# Patient Record
Sex: Male | Born: 1955 | Race: White | Hispanic: No | Marital: Married | Smoking: Former smoker
Health system: Southern US, Community
[De-identification: ages and names within clinical notes are randomized; demographics above are authoritative.]

## PROBLEM LIST (undated history)

## (undated) DIAGNOSIS — I499 Cardiac arrhythmia, unspecified: Secondary | ICD-10-CM

## (undated) DIAGNOSIS — J310 Chronic rhinitis: Secondary | ICD-10-CM

## (undated) DIAGNOSIS — T7840XA Allergy, unspecified, initial encounter: Secondary | ICD-10-CM

## (undated) DIAGNOSIS — E785 Hyperlipidemia, unspecified: Secondary | ICD-10-CM

## (undated) DIAGNOSIS — I10 Essential (primary) hypertension: Secondary | ICD-10-CM

## (undated) DIAGNOSIS — J309 Allergic rhinitis, unspecified: Secondary | ICD-10-CM

## (undated) DIAGNOSIS — J329 Chronic sinusitis, unspecified: Secondary | ICD-10-CM

## (undated) DIAGNOSIS — M1990 Unspecified osteoarthritis, unspecified site: Secondary | ICD-10-CM

## (undated) HISTORY — DX: Chronic rhinitis: J31.0

## (undated) HISTORY — DX: Allergy, unspecified, initial encounter: T78.40XA

## (undated) HISTORY — DX: Chronic sinusitis, unspecified: J32.9

## (undated) HISTORY — DX: Unspecified osteoarthritis, unspecified site: M19.90

## (undated) HISTORY — PX: OTHER SURGICAL HISTORY: SHX169

## (undated) HISTORY — DX: Hyperlipidemia, unspecified: E78.5

## (undated) HISTORY — DX: Cardiac arrhythmia, unspecified: I49.9

## (undated) HISTORY — PX: ROTATOR CUFF REPAIR: SHX139

## (undated) HISTORY — DX: Allergic rhinitis, unspecified: J30.9

## (undated) HISTORY — DX: Essential (primary) hypertension: I10

## (undated) HISTORY — PX: COLONOSCOPY: SHX174

---

## 2002-02-22 ENCOUNTER — Encounter: Payer: Self-pay | Admitting: Occupational Medicine

## 2002-02-22 ENCOUNTER — Encounter: Admission: RE | Admit: 2002-02-22 | Discharge: 2002-02-22 | Payer: Self-pay | Admitting: Occupational Medicine

## 2002-05-09 ENCOUNTER — Ambulatory Visit (HOSPITAL_BASED_OUTPATIENT_CLINIC_OR_DEPARTMENT_OTHER): Admission: RE | Admit: 2002-05-09 | Discharge: 2002-05-09 | Payer: Self-pay | Admitting: Orthopedic Surgery

## 2008-01-07 ENCOUNTER — Emergency Department (HOSPITAL_COMMUNITY): Admission: EM | Admit: 2008-01-07 | Discharge: 2008-01-07 | Payer: Self-pay | Admitting: Emergency Medicine

## 2008-01-09 ENCOUNTER — Ambulatory Visit: Payer: Self-pay | Admitting: Internal Medicine

## 2008-01-09 DIAGNOSIS — R002 Palpitations: Secondary | ICD-10-CM | POA: Insufficient documentation

## 2008-01-09 DIAGNOSIS — E049 Nontoxic goiter, unspecified: Secondary | ICD-10-CM | POA: Insufficient documentation

## 2008-01-09 DIAGNOSIS — J45909 Unspecified asthma, uncomplicated: Secondary | ICD-10-CM | POA: Insufficient documentation

## 2008-01-09 DIAGNOSIS — J309 Allergic rhinitis, unspecified: Secondary | ICD-10-CM | POA: Insufficient documentation

## 2008-01-10 ENCOUNTER — Telehealth: Payer: Self-pay | Admitting: Internal Medicine

## 2008-01-11 ENCOUNTER — Encounter: Admission: RE | Admit: 2008-01-11 | Discharge: 2008-01-11 | Payer: Self-pay | Admitting: Internal Medicine

## 2008-01-14 ENCOUNTER — Encounter: Payer: Self-pay | Admitting: Internal Medicine

## 2008-01-14 DIAGNOSIS — R222 Localized swelling, mass and lump, trunk: Secondary | ICD-10-CM | POA: Insufficient documentation

## 2008-01-19 ENCOUNTER — Encounter: Admission: RE | Admit: 2008-01-19 | Discharge: 2008-01-19 | Payer: Self-pay | Admitting: Internal Medicine

## 2008-01-21 ENCOUNTER — Encounter: Payer: Self-pay | Admitting: Internal Medicine

## 2008-01-21 ENCOUNTER — Ambulatory Visit: Payer: Self-pay

## 2008-01-21 ENCOUNTER — Telehealth (INDEPENDENT_AMBULATORY_CARE_PROVIDER_SITE_OTHER): Payer: Self-pay | Admitting: *Deleted

## 2008-01-22 ENCOUNTER — Encounter: Payer: Self-pay | Admitting: Cardiology

## 2008-01-25 ENCOUNTER — Ambulatory Visit: Payer: Self-pay

## 2008-01-25 ENCOUNTER — Ambulatory Visit: Payer: Self-pay | Admitting: Cardiology

## 2008-03-25 ENCOUNTER — Ambulatory Visit: Payer: Self-pay | Admitting: Cardiology

## 2008-04-24 ENCOUNTER — Ambulatory Visit: Payer: Self-pay | Admitting: Cardiology

## 2008-07-02 ENCOUNTER — Ambulatory Visit: Payer: Self-pay | Admitting: Internal Medicine

## 2008-07-02 DIAGNOSIS — F17200 Nicotine dependence, unspecified, uncomplicated: Secondary | ICD-10-CM | POA: Insufficient documentation

## 2008-07-03 ENCOUNTER — Encounter (INDEPENDENT_AMBULATORY_CARE_PROVIDER_SITE_OTHER): Payer: Self-pay | Admitting: *Deleted

## 2008-10-13 ENCOUNTER — Ambulatory Visit: Payer: Self-pay | Admitting: Internal Medicine

## 2008-10-13 DIAGNOSIS — J329 Chronic sinusitis, unspecified: Secondary | ICD-10-CM | POA: Insufficient documentation

## 2008-10-15 ENCOUNTER — Ambulatory Visit: Payer: Self-pay | Admitting: Cardiology

## 2008-11-26 ENCOUNTER — Ambulatory Visit: Payer: Self-pay | Admitting: Internal Medicine

## 2009-01-13 ENCOUNTER — Ambulatory Visit: Payer: Self-pay | Admitting: Internal Medicine

## 2011-01-04 ENCOUNTER — Ambulatory Visit
Admission: RE | Admit: 2011-01-04 | Discharge: 2011-01-04 | Payer: Self-pay | Source: Home / Self Care | Attending: Internal Medicine | Admitting: Internal Medicine

## 2011-01-04 ENCOUNTER — Encounter (INDEPENDENT_AMBULATORY_CARE_PROVIDER_SITE_OTHER): Payer: Self-pay | Admitting: *Deleted

## 2011-01-04 ENCOUNTER — Encounter: Payer: Self-pay | Admitting: Internal Medicine

## 2011-01-04 ENCOUNTER — Other Ambulatory Visit: Payer: Self-pay | Admitting: Internal Medicine

## 2011-01-04 DIAGNOSIS — R109 Unspecified abdominal pain: Secondary | ICD-10-CM | POA: Insufficient documentation

## 2011-01-04 DIAGNOSIS — I4949 Other premature depolarization: Secondary | ICD-10-CM | POA: Insufficient documentation

## 2011-01-04 DIAGNOSIS — K921 Melena: Secondary | ICD-10-CM | POA: Insufficient documentation

## 2011-01-04 DIAGNOSIS — E860 Dehydration: Secondary | ICD-10-CM | POA: Insufficient documentation

## 2011-01-04 LAB — H. PYLORI ANTIBODY, IGG: H Pylori IgG: NEGATIVE

## 2011-01-04 LAB — URINALYSIS, ROUTINE W REFLEX MICROSCOPIC
Bilirubin Urine: NEGATIVE
Ketones, ur: NEGATIVE
Leukocytes, UA: NEGATIVE
Nitrite: NEGATIVE
Specific Gravity, Urine: <=1.005 (ref 1.000–1.030)
Total Protein, Urine: NEGATIVE
Urine Glucose: NEGATIVE
Urobilinogen, UA: 0.2 (ref 0.0–1.0)
pH: 6 (ref 5.0–8.0)

## 2011-01-04 LAB — BASIC METABOLIC PANEL
BUN: 15 mg/dL (ref 6–23)
CO2: 29 mEq/L (ref 19–32)
Calcium: 9.3 mg/dL (ref 8.4–10.5)
Chloride: 104 mEq/L (ref 96–112)
Creatinine, Ser: 1.1 mg/dL (ref 0.4–1.5)
GFR: 77.44 mL/min (ref >60.00)
Glucose, Bld: 79 mg/dL (ref 70–99)
Potassium: 4.5 mEq/L (ref 3.5–5.1)
Sodium: 139 mEq/L (ref 135–145)

## 2011-01-04 LAB — CBC WITH DIFFERENTIAL/PLATELET
Basophils Absolute: 0.1 10*3/uL (ref 0.0–0.1)
Basophils Relative: 0.5 % (ref 0.0–3.0)
Eosinophils Absolute: 0.2 10*3/uL (ref 0.0–0.7)
Eosinophils Relative: 1.8 % (ref 0.0–5.0)
HCT: 46.8 % (ref 39.0–52.0)
Hemoglobin: 16.1 g/dL (ref 13.0–17.0)
Lymphocytes Relative: 31.5 % (ref 12.0–46.0)
Lymphs Abs: 3.8 10*3/uL (ref 0.7–4.0)
MCHC: 34.5 g/dL (ref 30.0–36.0)
MCV: 93.3 fl (ref 78.0–100.0)
Monocytes Absolute: 0.8 10*3/uL (ref 0.1–1.0)
Monocytes Relative: 6.4 % (ref 3.0–12.0)
Neutro Abs: 7.1 10*3/uL (ref 1.4–7.7)
Neutrophils Relative %: 59.8 % (ref 43.0–77.0)
Platelets: 282 10*3/uL (ref 150.0–400.0)
RBC: 5.01 Mil/uL (ref 4.22–5.81)
RDW: 12.8 % (ref 11.5–14.6)
WBC: 11.9 10*3/uL — ABNORMAL HIGH (ref 4.5–10.5)

## 2011-01-04 LAB — PSA: PSA: 1 ng/mL (ref 0.10–4.00)

## 2011-01-04 LAB — HEPATIC FUNCTION PANEL
ALT: 33 U/L (ref 0–53)
AST: 24 U/L (ref 0–37)
Albumin: 4.1 g/dL (ref 3.5–5.2)
Alkaline Phosphatase: 78 U/L (ref 39–117)
Bilirubin, Direct: 0.2 mg/dL (ref 0.0–0.3)
Total Bilirubin: 0.9 mg/dL (ref 0.3–1.2)
Total Protein: 7.5 g/dL (ref 6.0–8.3)

## 2011-01-04 LAB — LIPID PANEL
Cholesterol: 222 mg/dL — ABNORMAL HIGH (ref 0–200)
HDL: 31.9 mg/dL — ABNORMAL LOW (ref >39.00)
Total CHOL/HDL Ratio: 7
Triglycerides: 122 mg/dL (ref 0.0–149.0)
VLDL: 24.4 mg/dL (ref 0.0–40.0)

## 2011-01-04 LAB — IBC PANEL
Iron: 82 ug/dL (ref 42–165)
Saturation Ratios: 21.1 % (ref 20.0–50.0)
Transferrin: 277.9 mg/dL (ref 212.0–360.0)

## 2011-01-04 LAB — TSH: TSH: 1.28 u[IU]/mL (ref 0.35–5.50)

## 2011-01-05 ENCOUNTER — Encounter: Payer: Self-pay | Admitting: Gastroenterology

## 2011-01-05 ENCOUNTER — Encounter (INDEPENDENT_AMBULATORY_CARE_PROVIDER_SITE_OTHER): Payer: Self-pay | Admitting: *Deleted

## 2011-01-05 LAB — LDL CHOLESTEROL, DIRECT: Direct LDL: 173.5 mg/dL

## 2011-01-06 ENCOUNTER — Ambulatory Visit: Payer: Self-pay | Admitting: Internal Medicine

## 2011-01-07 ENCOUNTER — Ambulatory Visit
Admission: RE | Admit: 2011-01-07 | Discharge: 2011-01-07 | Payer: Self-pay | Source: Home / Self Care | Attending: Endocrinology | Admitting: Endocrinology

## 2011-01-07 ENCOUNTER — Ambulatory Visit
Admission: RE | Admit: 2011-01-07 | Discharge: 2011-01-07 | Payer: Self-pay | Source: Home / Self Care | Attending: Gastroenterology | Admitting: Gastroenterology

## 2011-01-07 ENCOUNTER — Encounter: Payer: Self-pay | Admitting: Endocrinology

## 2011-01-07 ENCOUNTER — Encounter (INDEPENDENT_AMBULATORY_CARE_PROVIDER_SITE_OTHER): Payer: Self-pay | Admitting: *Deleted

## 2011-01-07 DIAGNOSIS — F411 Generalized anxiety disorder: Secondary | ICD-10-CM | POA: Insufficient documentation

## 2011-01-07 DIAGNOSIS — K625 Hemorrhage of anus and rectum: Secondary | ICD-10-CM | POA: Insufficient documentation

## 2011-01-07 DIAGNOSIS — R12 Heartburn: Secondary | ICD-10-CM | POA: Insufficient documentation

## 2011-01-12 ENCOUNTER — Encounter: Payer: Self-pay | Admitting: Endocrinology

## 2011-01-17 LAB — CONVERTED CEMR LAB: Metaneph Total, Ur: 272 ug/24hr (ref 224–832)

## 2011-01-23 LAB — CONVERTED CEMR LAB
HDL: 28.9 mg/dL — ABNORMAL LOW (ref >39.0)
PSA: 1.01 ng/mL (ref 0.10–4.00)
VLDL: 24 mg/dL (ref 0–40)

## 2011-01-27 NOTE — Letter (Signed)
Summary: Out of Work  [facility]  [address] [place], [place] [address]   Phone: [phone]  Fax: [phone]    January 04, 2011   Employee:  Martin Walker    To Whom It May Concern:   For Medical reasons, please excuse the above named employee from work for the following dates:  Start:   01/04/2011  End:   01/06/2011  If you need additional information, please feel free to contact our office.         Sincerely,    Dr. Oliver Barre

## 2011-01-27 NOTE — Assessment & Plan Note (Signed)
History of Present Illness Visit Type: Initial Visit Primary GI MD: Rob Bunting MD Primary Provider: Oliver Barre, MD Chief Complaint: rectal bleeding History of Present Illness:     very pleasant 55 year old man who has had intermittent rectal bleeding for 10-12 years.  This always bright red.  Years ago he was usually constipated, but that has improved.  Overall stable weight.    He has had epigastric discomforts intermittently, tells me he thinks it is stress related.  eating does not clearly bring it on.  Used to take ibuprofen for his knees (several a day), stopped 5-6 years ago.  PCP visit earlier this week, left sided abd pains,  CT scan showed no clear pathology.    he does have pyrosis, was taking tums like meds pretty often.  never tried h2 blocker,  he did try prilosec once but stopped.             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  All other review of systems was otherwise negative.   Vital Signs:  Patient profile:   55 year old male Height:      67 inches Weight:      168.25 pounds BMI:     26.45 Pulse rate:   68 / minute Pulse rhythm:   regular BP sitting:   100 / 62  (left arm) Cuff size:   regular  Vitals Entered  By: June McMurray CMA Duncan Dull) (January 07, 2011 9:05 AM)  Physical Exam  Additional Exam:  Constitutional: generally well appearing Psychiatric: alert and oriented times 3 Eyes: extraocular movements intact Mouth: oropharynx moist, no lesions Neck: supple, no lymphadenopathy Cardiovascular: heart regular rate and rythm Lungs: CTA bilaterally Abdomen: soft, non-tender, non-distended, no obvious ascites, no peritoneal signs, normal bowel sounds Extremities: no lower extremity edema bilaterally Skin: no lesions on visible extremities Rectal exam deferred for upcoming colonoscopy   Impression & Recommendations:  Problem # 1:  intermittent rectal bleeding I did not mention above but he did have a CBC earlier this month and it was normal. His rectal bleeding is minor, it sounds anorectal in origin. He has never had a colonoscopy and we will proceed with one at his soonest convenience.  Problem # 2:  epigastric discomfort this is GERD-like. He takes TUMS periodically and have recommended that he change to over-the-counter proton pump inhibitor once daily. He has also been given a GERD handout. Specifically I mentioned that caffeine intake, eating meals within 2-3 hours of laying down to go to bed, cigarette smoking are all common contributors to GERD.  we will proceed with EGD at the same time as his colonoscopy to screen him for GERD complications.  Patient Instructions: 1)  One of your biggest health concerns is your smoking.  You should try your absolute best to stop.  If you need assistence, please contact your PCP or Smoking Cessation Class at [facility] [phone]) or [facility] (1-800-QUIT-NOW). 2)  Start OTC medicine (prilosec or prevacid or generic equivalent), take one pill every 20-30 min before breakfast. 3)  GERD handout.  4)  You will be scheduled to have a colonoscopy. 5)  You will be scheduled to have an upper endoscopy. 6)  A copy of this information will be  sent to Dr. Jonny Ruiz. 7)  The medication list was reviewed and reconciled.  All changed / newly prescribed medications were explained.  A complete medication list was provided to the patient / caregiver.  Appended Document: Orders Update/movi    Clinical Lists Changes  Problems: Added new problem of HEMORRHAGE OF RECTUM AND ANUS (ICD-569.3) Added new problem of HEARTBURN (ICD-787.1) Medications: Added new medication of MOVIPREP 100 GM  SOLR (PEG-KCL-NACL-NASULF-NA ASC-C) As per prep instructions. - Signed Rx of MOVIPREP 100 GM  SOLR (PEG-KCL-NACL-NASULF-NA ASC-C) As per prep instructions.;  #1 x 0;  Signed;  Entered by: Chales Abrahams CMA (AAMA);  Authorized by: Rachael Fee MD;  Method used: Electronically to [facility] [address] [place], [place], [place]  [address], Ph: [phone], Fax: [phone] Orders: Added new Test order of Colon/Endo (Colon/Endo) - Signed    Prescriptions: MOVIPREP 100 GM  SOLR (PEG-KCL-NACL-NASULF-NA ASC-C) As per prep instructions.  #1 x 0   Entered by:   Chales Abrahams CMA (AAMA)   Authorized by:   Rachael Fee MD   Signed by:   Chales Abrahams CMA (AAMA) on 01/07/2011   Method used:   Electronically to        [facility] (retail)       [address]       [place], [place]  [address]       Ph: [phone]       Fax: [phone]   RxID:   [id number]

## 2011-01-27 NOTE — Letter (Signed)
Summary: New Patient letter  [facility]  [address] [place], [place] [address]   Phone: [phone]  Fax: [phone]       01/05/2011 MRN: [id number]  Martin Walker [address] [place], [place]  [address]  Dear Martin Walker,  Welcome to the Gastroenterology Division at [facility].    You are scheduled to see Dr.  Jarold Motto    on 02-08-11 at 2pm on the 3rd floor at [facility], [address].  We ask that you try to arrive at our office 15 minutes prior to your appointment time to allow for check-in.  We would like you to complete the enclosed self-administered evaluation form prior to your visit and bring it with you on the day of your appointment.  We will review it with you.  Also, please bring a complete list of all your medications or, if you prefer, bring the medication bottles and we will list them.  Please bring your insurance card so that we may make a copy of it.  If your insurance requires a referral to see a specialist, please bring your referral form from your primary care physician.  Co-payments are due at the time of your visit and may be paid by cash, check or credit card.     Your office visit will consist of a consult with your physician (includes a physical exam), any laboratory testing he/she may order, scheduling of any necessary diagnostic testing (e.g. x-ray, ultrasound, CT-scan), and scheduling of a procedure (e.g. Endoscopy, Colonoscopy) if required.  Please allow enough time on your schedule to allow for any/all of these possibilities.    If you cannot keep your appointment, please call [phone] to cancel or reschedule prior to your appointment date.  This allows [place] the opportunity to schedule an appointment for another patient in need of care.  If you do not cancel or reschedule by 5 p.m. the business day prior to your appointment date, you will be charged a $50.00 late cancellation/no-show fee.    Thank you for  choosing [facility] for your medical needs.  We appreciate the opportunity to care for you.  Please visit [place] at our website  to learn more about our practice.                     Sincerely,                                                             The Gastroenterology Division

## 2011-01-27 NOTE — Assessment & Plan Note (Signed)
Summary: anxiety attack/ advised to go to uc or er on mon/ LOV 2009/ N...   Vital Signs:  Patient profile:   55 year old male Height:      67 inches Weight:      168 pounds BMI:     26.41 O2 Sat:      97 % on Room air Temp:     98.8 degrees F oral Pulse rate:   75 / minute BP supine:   132 / 90  (right arm) BP sitting:   122 / 82  (right arm) BP standing:   120 / 84  (right arm) Cuff size:   regular  Vitals Entered By: Zella Ball Ewing CMA Duncan Dull) (January 04, 2011 3:39 PM)  O2 Flow:  Room air CC: Anxiety/RE   Primary Care Provider:  Oliver Barre  CC:  Anxiety/RE.  History of Present Illness: here to re-establish after lost to f/u x 3 yrs with wellness and f/u; has mult issues, including signicant anxety today with palpitations and known hx of sympt PVC's/PAC's similar to episode jan 2009 with normal echo and holter with same an neg lab w/u;  but this episode today different in that there is significant fatigue and dizziness/lightheaded for 1-2 days , assoc with 1-2 wks worsening epigastric pain and tenderness with occas nausea, no vomiting, and no bowel change but has had intemittent BRBPR for "yrs."  Feels less dizzzy to lie down,  OTC antacid may have helped for a few hrs.  Pt denies CP, worsening sob, doe, wheezing, orthopnea, pnd, worsening LE edema, or syncope .  Pt denies new neuro symptoms such as headache, facial or extremity weakness  Pt denies polydipsia, polyuria  Overall good compliance with meds, trying to follow low chol diet, wt stable, little excercise however  .  No fever, wt loss, night sweats, loss of appetite or other constitutional symptoms  Denies worsening depressive symptoms, suicidal ideation, or panic.   Pt states good ability with ADL's, low fall risk, home safety reviewed and adequate, no significant change in hearing or vision, trying to follow lower chol diet, and occasionally active only with regular excercise.  No hx of PUD, and has avoided colonscopy so far.     Problems Prior to Update: 1)  Dehydration  (ICD-276.51) 2)  Preventive Health Care  (ICD-V70.0) 3)  Other Premature Beats  (ICD-427.69) 4)  Abdominal Pain, Unspecified Site  (ICD-789.00) 5)  Hematochezia  (ICD-578.1) 6)  Tobacco Use Disorder/smoker-smoking Cessation Discussed  (ICD-305.1) 7)  Rhinosinusitis, Recurrent  (ICD-473.9) 8)  Swelling, Mass, or Lump in Chest  (ICD-786.6) 9)  Goiter  (ICD-240.9) 10)  Special Screening Malignant Neoplasm of Prostate  (ICD-V76.44) 11)  Asthma  (ICD-493.90) 12)  Allergic Rhinitis  (ICD-477.9) 13)  Palpitations  (ICD-785.1)  Medications Prior to Update: 1)  Singulair 10 Mg Tabs (Montelukast Sodium) .Marland Kitchen.. 1 By Mouth Daily 2)  Fluticasone Propionate 50 Mcg/act  Susp (Fluticasone Propionate) .... 2 Sprays Each Nostril Once Daily 3)  Zyrtec-D Allergy & Congestion 5-120 Mg  Tb12 (Cetirizine-Pseudoephedrine) .Marland Kitchen.. 1 By Mouth Bid  Current Medications (verified): 1)  Omeprazole 40 Mg Cpdr (Omeprazole) .Marland Kitchen.. 1po Once Daily 2)  Promethazine Hcl 25 Mg Tabs (Promethazine Hcl) .Marland Kitchen.. 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 mother with HTN, thyroid disease grandfather with lung cancer uncle  wtih colon cancer grandmother with brain tumor Sister - childhood asthma  Social History: Reviewed history from 10/13/2008 and no changes required. 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         all otherwise negative per pt -    Physical Exam  General:  alert and well-developed.   Head:  normocephalic and atraumatic.   Eyes:  vision grossly intact, pupils equal, and pupils round.   Ears:  R ear normal and L ear normal.   Nose:  no external deformity and no nasal discharge.   Mouth:  no gingival abnormalities and pharynx pink and moist.   Neck:  supple and no masses.   Lungs:  normal respiratory effort and normal breath sounds.   Heart:  normal rate and regular rhythm.  with occasional ectopic beat by auscultation Abdomen:  soft and normal bowel sounds.  but mild to mod epigastric tender without guarding or rebound;  also has an LLQ unusual firmness not well defined approx 2-3 cm? but nontender  Msk:  no joint tenderness and no joint swelling.   Extremities:  no edema, no erythema  Neurologic:  strength normal in all extremities and gait normal.   Skin:  color normal and no rashes.   Psych:  not depressed appearing and moderately anxious.     Impression & Recommendations:  Problem # 1:  Preventive Health Care (ICD-V70.0) Overall doing well, age appropriate education and counseling updated, referral for preventive services and immunizations addressed, dietary counseling and smoking status adressed , most recent labs reviewed I have personally  reviewed and have noted 1.The patient's medical and social history 2.Their use of alcohol, tobacco or illicit drugs 3.Their current medications and supplements 4. Functional ability including ADL's, fall risk, home safety risk, hearing & visual impairment  5.Diet and physical activities 6.Evidence for depression or mood disorders The patients weight, height, BMI  have been recorded in the chart I have made referrals, counseling and provided education to the patient based review of the above  Orders: TLB-BMP (Basic Metabolic Panel-BMET) (80048-METABOL) TLB-CBC Platelet - w/Differential (85025-CBCD) TLB-Hepatic/Liver Function Pnl (80076-HEPATIC) TLB-Lipid Panel (80061-LIPID) TLB-TSH (Thyroid Stimulating Hormone) (84443-TSH) TLB-PSA (Prostate Specific Antigen) (84153-PSA) TLB-Udip ONLY (81003-UDIP)  Problem # 2:  HEMATOCHEZIA (ICD-578.1) unclear etiology- will need colonoscopy - check iron, refer GI Orders: TLB-IBC Pnl (Iron/FE;Transferrin) (83550-IBC) Gastroenterology Referral (GI)  Problem # 3:  ABDOMINAL PAIN, UNSPECIFIED SITE (ICD-789.00)  with epigastric pain and tenderness, but also has painless/nontender mass/fullness to the LLQ - for CT abd/pelvis, also for omeprazole 40 once daily, refer GI  Orders: Radiology Referral (Radiology) Gastroenterology Referral (GI) TLB-H. Pylori Abs(Helicobacter Pylori) (86677-HELICO) T-2 View CXR, Same Day (71020.5TC)  Problem # 4:  SWELLING,  MASS, OR LUMP IN CHEST (ICD-786.6) hx fo this related to prior CT 2009 , for f/u cxr, also with recent cough- ? smoker cough, nonprod  Problem # 5:  OTHER PREMATURE BEATS (ICD-427.69) sympt pvc 's most likely recurrent, likely exac by current low volume - eval and treat as above, f/u any worsening signs or symptoms   Problem # 6:  DEHYDRATION (ICD-276.51) likely due to GI /gastric issue - for fluids and eval and tx as above, gave note for work  Complete Medication List: 1)  Omeprazole 40 Mg Cpdr  (Omeprazole) .Marland Kitchen.. 1po once daily 2)  Promethazine Hcl 25 Mg Tabs (Promethazine hcl) .Marland Kitchen.. 1po q 6 hrs as needed nausea  Other Orders: EKG w/ Interpretation (93000)  Patient Instructions: 1)  You do have some evidence for dehydration today - please drink lots of fluids 2)  Please take all new medications as prescribed   - the antacid medication - omeprazole , and nausea medication if needed 3)  Please go to the Lab in the basement for your blood and/or urine tests today 4)  Please go to Radiology in the basement level for your X-Ray today  5)  You will be contacted about the referral(s) to: CT scan , and GI referral 6)  Please call the number on the [facility] Card for results of your testing  7)  Please schedule a follow-up appointment in 1 month. Prescriptions: PROMETHAZINE HCL 25 MG TABS (PROMETHAZINE HCL) 1po q 6 hrs as needed nausea  #40 x 1   Entered and Authorized by:   Corwin Levins MD   Signed by:   Corwin Levins MD on 01/04/2011   Method used:   Print then Give to Patient   RxID:   [id number] OMEPRAZOLE 40 MG CPDR (OMEPRAZOLE) 1po once daily  #90 x 3   Entered and Authorized by:   Corwin Levins MD   Signed by:   Corwin Levins MD on 01/04/2011   Method used:   Print then Give to Patient   RxID:   [id number]    Orders Added: 1)  EKG w/ Interpretation [93000] 2)  TLB-BMP (Basic Metabolic Panel-BMET) [80048-METABOL] 3)  TLB-CBC Platelet - w/Differential [85025-CBCD] 4)  TLB-Hepatic/Liver Function Pnl [80076-HEPATIC] 5)  TLB-Lipid Panel [80061-LIPID] 6)  TLB-TSH (Thyroid Stimulating Hormone) [84443-TSH] 7)  TLB-PSA (Prostate Specific Antigen) [84153-PSA] 8)  TLB-Udip ONLY [81003-UDIP] 9)  TLB-IBC Pnl (Iron/FE;Transferrin) [83550-IBC] 10)  Radiology Referral [Radiology] 11)  Gastroenterology Referral [GI] 12)  TLB-H. Pylori Abs(Helicobacter Pylori) [86677-HELICO] 13)  T-2 View CXR, Same Day [71020.5TC] 14)  New Patient 40-64 years [99386] 64)  New Patient Level IV  [84696]

## 2011-01-27 NOTE — Assessment & Plan Note (Signed)
Summary: walkin, ? anxiety/panic attack/john pt/cd   Vital Signs:  Patient profile:   55 year old male Height:      67 inches (170.18 cm) Weight:      169 pounds (76.82 kg) BMI:     26.56 O2 Sat:      94 % on Room air Temp:     98.4 degrees F (36.89 degrees C) oral Pulse rate:   72 / minute Pulse rhythm:   regular BP sitting:   100 / 62  (left arm) Cuff size:   regular  Vitals Entered By: Brenton Grills CMA Duncan Dull) (January 07, 2011 10:45 AM)  O2 Flow:  Room air CC: Anxiety, panic attacks, pt feels episodes are becoming more frequent (1st episode 2009)John pt/aj Is Patient Diabetic? No   Primary Provider:  Oliver Barre, MD  CC:  Anxiety, panic attacks, and pt feels episodes are becoming more frequent (1st episode 2009)John pt/aj.  History of Present Illness: pt had an episode of palpitations in 2009.  then, he caried an event monitor x 2 weeks, and had no sxs.  he now has 1 week of intermittent, but daily, severe palpitations in the chest, and assoc sob.  he is unable to cite precip factor, but it is relieved by rest.    Current Medications (verified): 1)  Moviprep 100 Gm  Solr (Peg-Kcl-Nacl-Nasulf-Na Asc-C) .... As Per Prep Instructions.  Allergies (verified): No Known Drug Allergies  Past History:  Past Medical History: Last updated: 01/07/2011 Allergic rhinitis Skin Test POS 11/26/08- Dust, cat recurrent rhinosinusitis Asthma- chilhood bronchitis arrythmia    Review of Systems  The patient denies fever, syncope, and headaches.         no flushing or pallor  Physical Exam  General:  normal appearance.   Lungs:  Clear to auscultation bilaterally. Normal respiratory effort.  Heart:  Regular rate and rhythm without murmurs or gallops noted. Normal S1,S2.     Impression & Recommendations:  Problem # 1:  PALPITATIONS (ICD-785.1) prob related to #2  Problem # 2:  ANXIETY STATE, UNSPECIFIED (ICD-300.00) Assessment: New uncertain etiology  Medications Added  to Medication List This Visit: 1)  Alprazolam 0.25 Mg Tabs (Alprazolam) .Marland Kitchen.. 1 every 6 hrs as needed for heart racing  Other Orders: T-Urine 24 Hr. Metanephrines [phone]) T-Urine 24 Hr. Catecholamines [phone]) Cardiology Referral (Cardiology) Est. Patient Level IV (29562)  Patient Instructions: 1)  check 24-hr urine test. 2)  then please call [phone] to hear your test results. 3)  refer cardiology.  you will be called with a day and time for an appointment. 4)  please call [facility], to classify this condition.  here is a card with their phone number. 5)  take alprazolam 0.25 mg every 6 hrs as needed for anxiety or heart racing. Prescriptions: ALPRAZOLAM 0.25 MG TABS (ALPRAZOLAM) 1 every 6 hrs as needed for heart racing  #30 x 1   Entered and Authorized by:   Minus Breeding MD   Signed by:   Minus Breeding MD on 01/07/2011   Method used:   Print then Give to Patient   RxID:   [id number]    Orders Added: 1)  T-Urine 24 Hr. Metanephrines [01027-25366] 2)  T-Urine 24 Hr. Catecholamines [phone] 3)  Cardiology Referral [Cardiology] 4)  Est. Patient Level IV [56387]

## 2011-01-27 NOTE — Letter (Signed)
Summary: New Patient letter  [facility]  [address] [place], [place] [address]   Phone: [phone]  Fax: [phone]       01/05/2011 MRN: [id number]  Martin Walker [address] [place], [place]  [address]  Dear Mr. Martin Walker,  Welcome to the Gastroenterology Division at [facility].    You are scheduled to see Dr.  Christella Hartigan on 01-07-11 at 9:15a.m. on the 3rd floor at [facility], [address].  We ask that you try to arrive at our office 15 minutes prior to your appointment time to allow for check-in.  We would like you to complete the enclosed self-administered evaluation form prior to your visit and bring it with you on the day of your appointment.  We will review it with you.  Also, please bring a complete list of all your medications or, if you prefer, bring the medication bottles and we will list them.  Please bring your insurance card so that we may make a copy of it.  If your insurance requires a referral to see a specialist, please bring your referral form from your primary care physician.  Co-payments are due at the time of your visit and may be paid by cash, check or credit card.     Your office visit will consist of a consult with your physician (includes a physical exam), any laboratory testing he/she may order, scheduling of any necessary diagnostic testing (e.g. x-ray, ultrasound, CT-scan), and scheduling of a procedure (e.g. Endoscopy, Colonoscopy) if required.  Please allow enough time on your schedule to allow for any/all of these possibilities.    If you cannot keep your appointment, please call [phone] to cancel or reschedule prior to your appointment date.  This allows [place] the opportunity to schedule an appointment for another patient in need of care.  If you do not cancel or reschedule by 5 p.m. the business day prior to your appointment date, you will be charged a $50.00 late cancellation/no-show fee.    Thank you for  choosing [facility] for your medical needs.  We appreciate the opportunity to care for you.  Please visit [place] at our website  to learn more about our practice.                     Sincerely,                                                             The Gastroenterology Division

## 2011-01-27 NOTE — Letter (Signed)
Summary: [facility] Instructions  [facility]  [address] [place], [place] [address]   Phone: [phone]  Fax: [phone]       Martin Walker    [birth year]    MRN: [id number]        Procedure Day /Date:02/04/11 FRI     Arrival Time:230 pm     Procedure Time:330 pm     Location of Procedure:                    X  [facility] (4th Floor)   PREPARATION FOR COLONOSCOPY WITH MOVIPREP   Starting 5 days prior to your procedure 01/30/11 do not eat nuts, seeds, popcorn, corn, beans, peas,  salads, or any raw vegetables.  Do not take any fiber supplements (e.g. Metamucil, Citrucel, and Benefiber).  THE DAY BEFORE YOUR PROCEDURE         DATE: 02/03/11  DAY: THURS  1.  Drink clear liquids the entire day-NO SOLID FOOD  2.  Do not drink anything colored red or purple.  Avoid juices with pulp.  No orange juice.  3.  Drink at least 64 oz. (8 glasses) of fluid/clear liquids during the day to prevent dehydration and help the prep work efficiently.  CLEAR LIQUIDS INCLUDE: Water Jello Ice Popsicles Tea (sugar ok, no milk/cream) Powdered fruit flavored drinks Coffee (sugar ok, no milk/cream) Gatorade Juice: apple, white grape, white cranberry  Lemonade Clear bullion, consomm, broth Carbonated beverages (any kind) Strained chicken noodle soup Hard Candy                             4.  In the morning, mix first dose of MoviPrep solution:    Empty 1 Pouch A and 1 Pouch B into the disposable container    Add lukewarm drinking water to the top line of the container. Mix to dissolve    Refrigerate (mixed solution should be used within 24 hrs)  5.  Begin drinking the prep at 5:00 p.m. The MoviPrep container is divided by 4 marks.   Every 15 minutes drink the solution down to the next mark (approximately 8 oz) until the full liter is complete.   6.  Follow completed prep with 16 oz of clear liquid of your choice (Nothing red or purple).  Continue to drink  clear liquids until bedtime.  7.  Before going to bed, mix second dose of MoviPrep solution:    Empty 1 Pouch A and 1 Pouch B into the disposable container    Add lukewarm drinking water to the top line of the container. Mix to dissolve    Refrigerate  THE DAY OF YOUR PROCEDURE      DATE: 02/04/11 DAY: FRI  Beginning at 1030 a.m. (5 hours before procedure):         1. Every 15 minutes, drink the solution down to the next mark (approx 8 oz) until the full liter is complete.  2. Follow completed prep with 16 oz. of clear liquid of your choice.    3. You may drink clear liquids until 130 pm  (2 HOURS BEFORE PROCEDURE).   MEDICATION INSTRUCTIONS  Unless otherwise instructed, you should take regular prescription medications with a small sip of water   as early as possible the morning of your procedure.           OTHER INSTRUCTIONS  You will need a responsible adult at least 54  years of age to accompany you and drive you home.   This person must remain in the waiting room during your procedure.  Wear loose fitting clothing that is easily removed.  Leave jewelry and other valuables at home.  However, you may wish to bring a book to read or  an iPod/MP3 player to listen to music as you wait for your procedure to start.  Remove all body piercing jewelry and leave at home.  Total time from sign-in until discharge is approximately 2-3 hours.  You should go home directly after your procedure and rest.  You can resume normal activities the  day after your procedure.  The day of your procedure you should not:   Drive   Make legal decisions   Operate machinery   Drink alcohol   Return to work  You will receive specific instructions about eating, activities and medications before you leave.    The above instructions have been reviewed and explained to me by   _______________________    I fully understand and can verbalize these instructions  _____________________________ Date _________

## 2011-01-27 NOTE — Letter (Signed)
Summary: Out of Work  [facility]  [address] [place], [place] [address]   Phone: [phone]  Fax: [phone]    January 07, 2011   Employee:  Martin Walker    To Whom It May Concern:   For Medical reasons, please excuse the above named employee from work for the following dates:  Start:   01/07/11  End:   01/10/11    Sincerely,    Minus Breeding MD

## 2011-02-03 ENCOUNTER — Telehealth: Payer: Self-pay | Admitting: Gastroenterology

## 2011-02-04 ENCOUNTER — Other Ambulatory Visit: Payer: Self-pay | Admitting: Gastroenterology

## 2011-02-04 ENCOUNTER — Encounter (AMBULATORY_SURGERY_CENTER): Payer: Federal, State, Local not specified - PPO | Admitting: Gastroenterology

## 2011-02-04 DIAGNOSIS — K649 Unspecified hemorrhoids: Secondary | ICD-10-CM | POA: Insufficient documentation

## 2011-02-04 DIAGNOSIS — K625 Hemorrhage of anus and rectum: Secondary | ICD-10-CM

## 2011-02-04 DIAGNOSIS — D126 Benign neoplasm of colon, unspecified: Secondary | ICD-10-CM

## 2011-02-04 DIAGNOSIS — K2100 Gastro-esophageal reflux disease with esophagitis, without bleeding: Secondary | ICD-10-CM

## 2011-02-09 ENCOUNTER — Encounter: Payer: Self-pay | Admitting: Gastroenterology

## 2011-02-10 NOTE — Procedures (Addendum)
Summary: Colonoscopy  Patient: Martin Walker Note: All result statuses are Final unless otherwise noted.  Tests: (1) Colonoscopy (COL)   COL Colonoscopy           DONE     [facility]     [address]     [place], [place]  [address]           COLONOSCOPY PROCEDURE REPORT           PATIENT:  [name], [name]  MR#:  [id number]     BIRTHDATE:  1956-06-29, 54 yrs. old  GENDER:  male     ENDOSCOPIST:  Rachael Fee, MD     REF. BY:  Oliver Barre, M.D.     PROCEDURE DATE:  02/04/2011     PROCEDURE:  Colonoscopy with snare polypectomy     ASA CLASS:  Class II     INDICATIONS:  minor rectal bleeding     MEDICATIONS:   Fentanyl 50 mcg IV, Versed 6 mg IV           DESCRIPTION OF PROCEDURE:   After the risks benefits and     alternatives of the procedure were thoroughly explained, informed     consent was obtained.  Digital rectal exam was performed and     revealed no rectal masses.   The LB CF-H180AL [id number] endoscope     was introduced through the anus and advanced to the cecum, which     was identified by both the appendix and ileocecal valve, without     limitations.  The quality of the prep was good, using MoviPrep.     The instrument was then slowly withdrawn as the colon was fully     examined.     <<PROCEDUREIMAGES>>           FINDINGS:  A pedunculated polyp was found in the descending colon.     This was 12mm across, removed with snare/cautery and sent to     pathology (jar 1) (see image4).  Internal and external Hemorrhoids     were found. These were medium to large sized.  This was otherwise     a normal examination of the colon (see image5, image2, and     image1).   Retroflexed views in the rectum revealed no     abnormalities.    The scope was then withdrawn from the patient     and the procedure completed.           COMPLICATIONS:  None     ENDOSCOPIC IMPRESSION:     1) Pedunculated polyp in the descending colon, removed and sent     to pathology     2)  medium to large sized internal and external hemorrhoids     3) Otherwise normal examination           RECOMMENDATIONS:     1) If the polyp(s) removed today are proven to be adenomatous     (pre-cancerous) polyps, you will need a colonoscopy in 3 years.     Otherwise you should continue to follow colorectal cancer     screening guidelines for "routine risk" patients with a     colonoscopy in 10 years.     2) You will receive a letter within 1-2 weeks with the results     of your biopsy as well as final recommendations. Please call my     office if you have not received a letter after 3 weeks.  3) Dr. Christella Hartigan' office will call you to set up referral to general     surgeon to consider hemorrhoidectomy           ______________________________     Rachael Fee, MD           n.     eSIGNED:   Rachael Fee at 02/04/2011 03:22 PM           Rosebud Poles, [id number]  Note: An exclamation mark (!) indicates a result that was not dispersed into the flowsheet. Document Creation Date: 02/04/2011 3:23 PM _______________________________________________________________________  (1) Order result status: Final Collection or observation date-time: 02/04/2011 15:19 Requested date-time:  Receipt date-time:  Reported date-time:  Referring Physician:   Ordering Physician: Rob Bunting [phone]) Specimen Source:  Source: Launa Grill Order Number: [id number] Lab site:   Appended Document: Orders Update/CCS    Clinical Lists Changes  Problems: Added new problem of HEMORRHOIDS (ICD-455.6) Orders: Added new Test order of [facility] Surgery ([facility]) - Signed      Appended Document: Colonoscopy     Procedures Next Due Date:    Colonoscopy: 01/2014

## 2011-02-10 NOTE — Progress Notes (Signed)
Summary: Procedure  Phone Note Call from Patient Call back at Home Phone [phone]   Caller: Patient Call For: Dr. Christella Hartigan Reason for Call: Talk to Nurse Summary of Call: Pt forgot about his restricted diet and had eaten beans and lettuce this week, does he need to reschedule his double procedure Initial call taken by: [place] Johnson,  February 03, 2011 3:01 PM  Follow-up for Phone Call        Patient stated that he had lots of beans on Monday night.  He also said that he had some others for lunch yesterday.  I explained that the prep would probably clean him out, and the reason why we ask pts not to eat beans was that they clog our scopes sometimes.  He promised not to eat anything today that was "banned." Follow-up by: Clide Cliff RN,  February 03, 2011 3:07 PM

## 2011-02-10 NOTE — Procedures (Signed)
Summary: Upper Endoscopy  Patient: Martin Walker Note: All result statuses are Final unless otherwise noted.  Tests: (1) Upper Endoscopy (EGD)   EGD Upper Endoscopy       DONE     [facility]     [address]     [place], [place]  [address]           ENDOSCOPY PROCEDURE REPORT           PATIENT:  [name], [name]  MR#:  [id number]     BIRTHDATE:  August 13, 1956, 54 yrs. old  GENDER:  male     ENDOSCOPIST:  Rachael Fee, MD     PROCEDURE DATE:  02/04/2011     PROCEDURE:  EGD, diagnostic [phone]     ASA CLASS:  Class II     INDICATIONS:  GERD, symptoms have improved with diet changes     MEDICATIONS:  There was residual sedation effect present from     prior procedure, Fentanyl 25 mcg IV, Versed 2 mg IV     TOPICAL ANESTHETIC:  Exactacain Spray           DESCRIPTION OF PROCEDURE:   After the risks benefits and     alternatives of the procedure were thoroughly explained, informed     consent was obtained.  The LB GIF-H180 [id number] endoscope was     introduced through the mouth and advanced to the second portion of     the duodenum, without limitations.  The instrument was slowly     withdrawn as the mucosa was fully examined.     <<PROCEDUREIMAGES>>           There was mild reflux related esophagitis (see image1).  Otherwise     the examination was normal (see image2, image4, image3, image5,     and image6).    Retroflexed views revealed no abnormalities.     The scope was then withdrawn from the patient and the procedure     completed.           COMPLICATIONS:  None           ENDOSCOPIC IMPRESSION:     1) Mild reflux related esophagitis     2) Otherwise normal examination           RECOMMENDATIONS:     His GERD symptoms have improved with diet changes but he still     has some acid damage to esophagus. Should start once daily OTC PPI     (prilosec or prevacid or generic equivalent).           ______________________________     Rachael Fee, MD        cc: Oliver Barre, MD           n.     Rosalie Doctor:   Rachael Fee at 02/04/2011 03:31 PM           Rosebud Poles, [id number]  Note: An exclamation mark (!) indicates a result that was not dispersed into the flowsheet. Document Creation Date: 02/04/2011 3:31 PM _______________________________________________________________________  (1) Order result status: Final Collection or observation date-time: 02/04/2011 15:27 Requested date-time:  Receipt date-time:  Reported date-time:  Referring Physician:   Ordering Physician: Rob Bunting [phone]) Specimen Source:  Source: Launa Grill Order Number: [id number] Lab site:

## 2011-02-15 ENCOUNTER — Encounter: Payer: Self-pay | Admitting: Internal Medicine

## 2011-02-16 NOTE — Letter (Signed)
Summary: Results Letter  [facility]  [address] [place], [place] [address]   Phone: [phone]  Fax: [phone]        February 09, 2011 MRN: [id number]    Martin Walker [address] [place], [place]  [address]    Dear [name],   The polyp removed during your recent procedure was proven to be adenomatous.  These are pre-cancerous polyps that may have grown into cancers if they had not been removed.  Based on current nationally recognized surveillance guidelines, I recommend that you have a repeat colonoscopy in 3 years.   We will therefore put your information in our reminder system and will contact you in 3 years to schedule a repeat procedure.  Please call if you have any questions or concerns.       Sincerely,  Rachael Fee MD  This letter has been electronically signed by your physician.

## 2011-02-17 ENCOUNTER — Encounter: Payer: Self-pay | Admitting: Cardiology

## 2011-02-17 ENCOUNTER — Encounter (INDEPENDENT_AMBULATORY_CARE_PROVIDER_SITE_OTHER): Payer: Federal, State, Local not specified - PPO | Admitting: Cardiology

## 2011-02-17 DIAGNOSIS — R002 Palpitations: Secondary | ICD-10-CM

## 2011-02-22 NOTE — Procedures (Signed)
Summary: [facility]   Imported By: Marylou Mccoy 02/10/2011 15:41:49  _____________________________________________________________________  External Attachment:    Type:   Image     Comment:   External Document

## 2011-02-22 NOTE — Assessment & Plan Note (Signed)
Summary: palpiations, per deborah pt has bcbs.lg/sp   Visit Type:  Follow-up Primary Provider:  Oliver Barre, MD  CC:  palpitations.  History of Present Illness: The patient presents for evaluation of tachycardia palpitations. He had similar symptoms back in 2009.An event monitor did not record any arrhythmias. The treadmill was negative for any evidence of ischemia. He symptoms abated at that time. Now he has had a recurrence of a very similar symptom starting in early January. He had some severe episodes of this in with one where he had to be driven home from work.  He did see his primary MD and was given a prescription for Xanax. Since then if he takes one of these when the symptoms start a seem to be much more limited. These episodes occur at rest. He cannot bring them on. They have lasted up to an hour at a time. He feels his heart skipping. He becomes anxious. He become short of breath. There is no presyncope or syncope though he feels lightheaded. He is not describing chest pressure, neck or arm discomfort. He is not having these episodes he feels well. He does think that they are slowly improving over the past week or so.  Current Medications (verified): 1)  Alprazolam 0.25 Mg Tabs (Alprazolam) .Marland Kitchen.. 1 Every 6 Hrs As Needed For Heart Racing  Allergies (verified): No Known Drug Allergies  Past History:  Past Medical History: Allergic rhinitis Skin Test POS 11/26/08- Dust, cat Recurrent rhinosinusitis Asthma- chilhood  Past Surgical History: Left knee surgury Right wrist surgury    Family History: Reviewed history from 01/07/2011 and no changes required. Mother with HTN, thyroid disease Grandfather with lung cancer Uncle wtih colon cancer Grandmother with brain tumor Sister - childhood asthma    Social History: Reviewed history from 01/07/2011 and no changes required. 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   Vital Signs:  Patient profile:   55 year old male Height:      67 inches Weight:      170 pounds BMI:     26.72 Pulse rate:   75 / minute Resp:     16 per minute BP sitting:   145 / 91  (left arm)  Vitals Entered By: Marrion Coy, CNA (February 17, 2011 2:16 PM)  Physical Exam  General:  Well developed, well nourished, in no acute distress. Head:  normocephalic and atraumatic Eyes:  PERRLA/EOM intact; conjunctiva and lids normal. Mouth:  Teeth, gums and palate normal. Oral mucosa normal. Neck:  Neck supple, no JVD. No masses, thyromegaly or abnormal cervical nodes. Chest Wall:  no deformities or breast masses noted Lungs:  Clear bilaterally to auscultation and percussion. Abdomen:  Bowel sounds positive; abdomen soft and non-tender without masses, organomegaly, or hernias noted. No hepatosplenomegaly. Msk:  Back normal, normal gait. Muscle strength and tone normal. Extremities:  No clubbing or cyanosis. Neurologic:  Alert and oriented x 3. Skin:  Intact without lesions or rashes. Cervical Nodes:  no significant adenopathy Axillary Nodes:  no significant adenopathy Psych:  Normal affect.   Detailed Cardiovascular Exam  Neck    Carotids: Carotids full and equal bilaterally without bruits.      Neck Veins: Normal, no JVD.    Heart    Inspection: no deformities  or lifts noted.      Palpation: normal PMI with no thrills palpable.      Auscultation: regular rate and rhythm, S1, S2 without murmurs, rubs, gallops, or clicks.    Vascular    Abdominal Aorta: no palpable masses, pulsations, or audible bruits.      Femoral Pulses: normal femoral pulses bilaterally.      Pedal Pulses: normal pedal pulses bilaterally.      Radial Pulses: normal radial pulses bilaterally.      Peripheral Circulation: no clubbing, cyanosis, or edema noted with normal capillary refill.      EKG  Procedure date:  01/04/2011  Findings:      Sinus rhythm, rate 73, axis within normal limits, intervals within normal limits, no acute ST-T wave changes.  Impression & Recommendations:  Problem # 1:  PALPITATIONS (ICD-785.1) The patient presents with symptoms consistent with tachycardia palpitations. They seem to be abating however. At this point he will continue with the Xanax as needed and if these get worse I will prescribe a "pill in the pocket" beta blocker dose. Since they are getting better he does not want to wear an event monitor at this point. He has had routine labs.  Problem # 2:  PREVENTIVE HEALTH CARE (ICD-V70.0) I do plan on bringing him back for exercise treadmill test. He wants to start an exercise regimen. I would like to screen him for coronary disease and in particular give him a prescription for exercise based on these results.  Problem # 3:  TOBACCO USE DISORDER/SMOKER-SMOKING CESSATION DISCUSSED (ICD-305.1) We discussed the need to stop smoking. (Greater tha 3 minutes.)  Other Orders: Treadmill (Treadmill)  Patient Instructions: 1)  Your physician recommends that you schedule a follow-up appointment at the time of your treadmill 2)  Your physician recommends that you continue on your current medications as directed. Please refer to the Current Medication list given to you today. 3)  Your physician has requested that you have an exercise tolerance test.  For further information please visit [email or web address]  Please also follow instruction sheet, as given.

## 2011-03-03 NOTE — Consult Note (Addendum)
Summary: [facility]   Imported By: Sherian Rein 02/24/2011 07:19:55  _____________________________________________________________________  External Attachment:    Type:   Image     Comment:   External Document

## 2011-03-14 ENCOUNTER — Encounter: Payer: Self-pay | Admitting: Cardiology

## 2011-03-14 ENCOUNTER — Encounter: Payer: Federal, State, Local not specified - PPO | Admitting: Cardiology

## 2011-03-14 ENCOUNTER — Encounter (INDEPENDENT_AMBULATORY_CARE_PROVIDER_SITE_OTHER): Payer: Federal, State, Local not specified - PPO | Admitting: Cardiology

## 2011-03-14 DIAGNOSIS — R0602 Shortness of breath: Secondary | ICD-10-CM

## 2011-03-14 DIAGNOSIS — R002 Palpitations: Secondary | ICD-10-CM

## 2011-05-04 ENCOUNTER — Encounter: Payer: Self-pay | Admitting: Cardiology

## 2011-05-06 ENCOUNTER — Encounter: Payer: Self-pay | Admitting: Cardiology

## 2011-05-06 ENCOUNTER — Ambulatory Visit (INDEPENDENT_AMBULATORY_CARE_PROVIDER_SITE_OTHER): Payer: Federal, State, Local not specified - PPO | Admitting: Cardiology

## 2011-05-06 DIAGNOSIS — I4949 Other premature depolarization: Secondary | ICD-10-CM

## 2011-05-06 DIAGNOSIS — F17200 Nicotine dependence, unspecified, uncomplicated: Secondary | ICD-10-CM

## 2011-05-06 DIAGNOSIS — R002 Palpitations: Secondary | ICD-10-CM

## 2011-05-06 MED ORDER — METOPROLOL TARTRATE 25 MG PO TABS: ORAL_TABLET | ORAL | Status: DC

## 2011-05-06 NOTE — Progress Notes (Signed)
HPI The patient presents for evaluation of palpitations.  Since I last saw him he stop smoking completely. We did a treadmill at the last visit and he had an excellent result. He still has some palpitations and has been taking p.r.n. Metoprolol. He'll feel his heart racing if he is excited but he does not need to be upset. He said no presyncope or syncope. He has had no chest discomfort, neck or arm discomfort. He has had no new shortness of breath, PND or orthopnea.  No Known Allergies  Current Outpatient Prescriptions  Medication Sig Dispense Refill  . ALPRAZolam (XANAX) 0.25 MG tablet 1 every 6 hrs as needed for heart racing       . metoprolol tartrate (LOPRESSOR) 25 MG tablet Take 0.5 tablets by mouth as needed.        Past Medical History  Diagnosis Date  . Rhinitis, allergic     skin test pos 11/26/08- dust,cat  . Rhinosinusitis     recurrent  . Asthma     chilhood    Past Surgical History  Procedure Date  . Left knee   . Right wrist     ROS:  As stated in the HPI and negative for all other systems.  PHYSICAL EXAM BP 122/78  Pulse 64  Ht 5\' 7"  (1.702 m)  Wt 170 lb (77.111 kg)  BMI 26.63 kg/m2 GENERAL:  Well appearing HEENT:  Pupils equal round and reactive, fundi not visualized, oral mucosa unremarkable NECK:  No jugular venous distention, waveform within normal limits, carotid upstroke brisk and symmetric, no bruits, no thyromegaly LYMPHATICS:  No cervical, inguinal adenopathy LUNGS:  Clear to auscultation bilaterally BACK:  No CVA tenderness CHEST:  Unremarkable HEART:  PMI not displaced or sustained,S1 and S2 within normal limits, no S3, no S4, no clicks, no rubs, no murmurs ABD:  Flat, positive bowel sounds normal in frequency in pitch, no bruits, no rebound, no guarding, no midline pulsatile mass, no hepatomegaly, no splenomegaly EXT:  2 plus pulses throughout, no edema, no cyanosis no clubbing SKIN:  No rashes no nodules NEURO:  Cranial nerves II through XII  grossly intact, motor grossly intact throughout PSYCH:  Cognitively intact, oriented to person place and time  EKG:  Sinus rhythm, rate 64, axis within normal limits, intervals within normal limits, no acute ST-T wave changes.   ASSESSMENT AND PLAN

## 2011-05-06 NOTE — Assessment & Plan Note (Signed)
She continues to have some palpitations change his metoprolol to 12.5 mg b.i.d. scheduled. He will let me know if  this helps. If not we will consider further therapy.

## 2011-05-06 NOTE — Assessment & Plan Note (Signed)
He says he is done with tobacco and I applaud this.

## 2011-05-06 NOTE — Patient Instructions (Signed)
Please take Metoprolol 12.5 mg twice daily Continue other medications as listed Follow up in 6 months with Dr Antoine Poche

## 2011-05-10 NOTE — Assessment & Plan Note (Signed)
[name]                            CARDIOLOGY OFFICE NOTE   NAME:Martin [name]                  MRN:          [id number]  DATE:03/25/2008                            DOB:          1956-02-24    PRIMARY CARE PHYSICIAN:  Martin Levins, MD   REASON FOR PRESENTATION:  Evaluate patient with palpitations.   HISTORY OF PRESENT ILLNESS:  The patient had a severe episode of a rapid  heart rate on January 12.  This is extensively described in my January  30 note.  He said with that he felt like I was going to die.  He did  have an the ER evaluation as described.  He had follow-up echocardiogram  in our office.  After I saw him, he wore an event monitor.  However, he  had very rare palpitations.  He did not even have time to press the  event indicator.  There were no transmitted rhythm strips.   Since that time the patient has had maybe six skipped beats.  These have  been fleeting and sporadic.  He has not had any of the severe episodes  like he had that day in January.  He has had no new chest pressure, neck  or arm discomfort.  He has had no PND or orthopnea.  He continues to  smoke cigarettes, although he is down to six cigarettes a day.  He is  trying to wean.   PAST MEDICAL HISTORY:  1. Tobacco abuse.  2. Dyslipidemia.  3. Wrist surgery.  4. Knee surgery.   ALLERGIES:  ANTI-INFLAMMATORIES.   CURRENT MEDICATIONS:  None.   REVIEW OF SYSTEMS:  As stated in the HPI and otherwise negative for have  other systems.   PHYSICAL EXAMINATION:  The patient is in no distress.  Blood pressure 149/95, heart rate 76 and regular, weight 157 pounds,  body mass index 24.  HEENT:  Eyelids unremarkable.  Pupils equal, round and reactive to  light.  Fundi not visualized.  Oral mucosa unremarkable.  NECK:  No jugular venous distention at 45 degrees.  Carotid upstroke  brisk and symmetrical.  No bruits, no thyromegaly.  LYMPHATICS:  No cervical, axillary  or inguinal adenopathy.  LUNGS:  Clear to auscultation bilaterally.  BACK:  No costovertebral angle tenderness.  CHEST:  Unremarkable.  HEART:  PMI not displaced or sustained, S1 and S2 within normal limits,  no S3, no S4, no clicks, no rubs, no murmurs.  ABDOMEN:  Flat, positive bowel sounds, normal in frequency and pitch, no  bruits, no rebound, no guarding, no midline pulsatile mass.  No  hepatomegaly, no splenomegaly.  SKIN:  No rashes, no nodules.  EXTREMITIES:  2+ pulses throughout, no edema, no cyanosis, no clubbing.  NEUROLOGIC:  Oriented to person, place and time.  Cranial nerves II-XII  grossly intact.  Motor grossly intact.   ASSESSMENT/PLAN:  1. Tachypalpitations.  The patient has had no further      tachypalpitations.  However, he is still quite concerned about this      event.  He said, I fell like I was going to  die.  Given his risk      factors and the severity of these symptoms, I do think it is      prudent put him on a treadmill and do a plain old exercise      treadmill test (POET).  This will allow me to see if there is any      activity-induced dysrhythmia.  This will allow me to screen for      obstructive coronary disease given his family history.  It will      also allow me to judge his blood pressure and give him a      prescription for exercise.  2. Hypertension.  Blood pressure is slightly elevated.  This is      unusual.  He will keep an eye on this at home and we will check it      at the time of his treadmill.  3. Tobacco.  He was urged again (greater than 3 minutes' consultation)      to quit smoking completely.  Hopefully, he can be successful with      this.  4. Follow-up.  I will see the patient again at the time of his POET.     Rollene Rotunda, MD, [name]  Electronically Signed    JH/MedQ  DD: 03/25/2008  DT: 03/26/2008  Job #: [id number]   cc:   Martin Levins, MD

## 2011-05-10 NOTE — Procedures (Signed)
 [facility]                              EXERCISE TREADMILL   NAME:[name], Martin AYOUB                  MRN:          [id number]  DATE:04/24/2008                            DOB:          05-20-1956    PRIMARY CARE PHYSICIAN:  Dr. Jonny Ruiz.   PROCEDURE:  Exercise treadmill test.   INDICATIONS:  Evaluate patient with palpitations, hypertension and  shortness of breath.   PROCEDURE NOTE:  The patient was exercised using standard Bruce  protocol.  He was able to exercise for 12 minutes.  He achieved his  target heart rate with a peak of 166, which was 98% of predicted.  The  test was terminated, because he achieved target his heart rate and  because of fatigue.  He achieved 13.4 mets with a peak blood pressure  187/82.  There were no ischemic ST-T wave changes.  There were no  arrhythmias.  He had no chest pain.  His shortness of breath was  appropriate.  He had normal heart rate recovery.   CONCLUSION:  Negative adequate exercise treadmill test with an excellent  exercise tolerance.  There is no evidence for high-grade obstructive  coronary disease.   PLAN:  Based on the above, no further cardiovascular testing is  suggested.  The patient has had no further episodes and no symptoms.  He  was given a prescription for exercise, based on these results.     Rollene Rotunda, MD, [facility]  Electronically Signed    JH/MedQ  DD: 04/24/2008  DT: 04/24/2008  Job #: [id number]   cc:   Corwin Levins, MD

## 2011-05-10 NOTE — Assessment & Plan Note (Signed)
[facility]                            CARDIOLOGY OFFICE NOTE   NAME:Martin [name], Martin [name]                  MRN:          [id number]  DATE:01/25/2008                            DOB:          18-Oct-1956    PRIMARY CARE PHYSICIAN:  Dr. Oliver Barre.   REASON FOR PRESENTATION:  Evaluate patient with palpitations.   HISTORY OF PRESENT ILLNESS:  The patient is a pleasant 55 year old  gentleman without prior cardiac history.  He noticed over the past month  or so palpitations.  These initially were infrequent.  However, on  January 12 he had a severe episode.  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  He felt his heart having the salvos of a rapid rhythm.  He was  quite uncomfortable and presyncopal.  He felt like he was a little short  of breath.  He called EMS and was taken to the emergency room.  He did  not have any chest pressure, neck or arm discomfort.  These happened  really without rest or provocation.  He has been unable to bring these  on.  In the emergency room they were apparently not caught on a monitor.  He had no significant abnormalities other than a slightly elevated white  blood cell count.  He has had a follow-up echocardiogram done which  demonstrated no abnormalities.  He has worn a Holter monitor, and I am  reading this today.  This demonstrates a few PAC's but no other  sustained arrhythmia.  He did not have any of his symptoms  while  wearing this.  Since the ER visit he has continued to have these, but  they are less frequent.  They may last for 5-10 minutes at a time,  however.  He has had none over the last 3-4 days.  Of note he does drink  1/2 gallon of sweet tea a day.   PAST MEDICAL HISTORY:  Recently diagnosed hyperlipidemia.   PAST SURGICAL HISTORY:  Wrist surgery, knee surgery.   ALLERGIES:  ANTI-INFLAMMATORIES.   MEDICATIONS:  None.   SOCIAL HISTORY:  The patient works for [employer].  He is married and has one  child.  He does  not exercise.  He smokes 3/4 of a pack of cigarettes.   FAMILY HISTORY:  Noncontributory for early coronary disease, though his  father had heart disease diagnosed at age 33.  He has older brothers and  sisters without heart disease.   REVIEW OF SYSTEMS:  As stated in the HPI and positive for arthritis,  reading glasses.  Negative for other systems.   PHYSICAL EXAMINATION:  GENERAL:  The patient is in no distress.  VITAL SIGNS:  Blood pressure 138/87, heart rate 69 and regular, weight  163 pounds.  HEENT:  Eyes unremarkable, pupils equal, round, reactive to light, fundi  within normal limits, oral mucosa unremarkable.  NECK:  No jugular venous distention at 45 degrees, carotid upstroke  brisk and symmetrical.  No bruits, no thyromegaly.  LYMPHATICS:  No cervical, axillary or inguinal adenopathy.  LUNGS:  Clear to auscultation bilaterally.  BACK:  No costovertebral tenderness.  CHEST:  Unremarkable.  HEART:  PMI not displaced or sustained, S1, S2 within normal limits, no  S3, no S4, no clicks, no rubs, no murmurs.  ABDOMEN:  Flat, positive bowel sounds normal in frequency and pitch, no  bruits, no rebound, no guarding, no midline pulsatile mass, no  hepatomegaly, no splenomegaly.  SKIN:  No rashes, no nodules.  EXTREMITIES:  2+ pulses throughout, no edema, no cyanosis or clubbing.  NEUROLOGICAL:  Oriented to person, place, time, cranial nerves II-XII  grossly intact, motor grossly intact.   EKG:  Sinus rhythm, rate 69, axis within normal limits, intervals within  normal limits, no acute ST wave change.   ASSESSMENT/PLAN:  1. The patient has palpitations.  These were not caught on the 24-hour      monitor.  He will need a two week monitor when he returns to work      as he was having these symptoms fairly consistently with work.  I      did look to see if he has a TSH, but I did not find one.  However,      I know he has had evaluations of his thyroid and thymus with       ultrasound and CT.  I will defer to Dr. Jonny Ruiz.  I suspect he has had      a thyroid profile or TSH drawn.  2. Tobacco.  We spent greater than three minutes discussing that he      stop smoking.  He is going to try over-the-counter nicotine      replacement.  We discussed Chantix and Wellbutrin.  3. Dyslipidemia.  He has LDL 152.  He wants to try diet first, and he      has discussed this with Dr. Jonny Ruiz.  I told him he needs to have it      followed up in 2-3 months.  4. Weight.  He is slightly overweight.  Will discuss this when he      comes back.  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  The suggestion      is follow-up.  This was ordered by Dr. Jonny Ruiz.  I did discuss with      the patient.  He is going to discuss with Dr. Jonny Ruiz and understands      that he needs follow-up.  6. Follow-up.  I will see the patient back in about six weeks or      sooner if we find anything on a two week monitor or if he has any      worsening symptoms.     Rollene Rotunda, MD, [facility]  Electronically Signed    JH/MedQ  DD: 01/25/2008  DT: 01/26/2008  Job #: [id number]   cc:   Corwin Levins, MD

## 2011-05-13 NOTE — Op Note (Signed)
[place]  Patient:    Martin [facility], Martin Walker Visit Number: [id number] MRN: [id number]          Service Type: DSU Location: [facility] Attending Physician:  Nadara Mustard Dictated by:   Nadara Mustard, M.D. Proc. Date: 05/09/02 Admit Date:  05/09/2002 Discharge Date: 05/09/2002                             Operative Report  PREOPERATIVE DIAGNOSIS:  Lunatotriquetral right wrist tear.  POSTOPERATIVE DIAGNOSIS: 1. Scapholunate tear. 2. Triangular fibrocartilage complex peripheral tear. 3. Synovitis.  OPERATION PERFORMED: 1. Right wrist arthroscopy with debridement of scapholunate tear. 2. Debridement of triangular fibrocartilage complex tear. 3. Debridement of synovitis.  SURGEON:  Nadara Mustard, M.D.  ANESTHESIA:  Axillary block.  ESTIMATED BLOOD LOSS:  Minimal.  ANTIBIOTICS:  None.  DRAINS:  None.  COMPLICATIONS:  None.  DISPOSITION:  To PACU in stable condition.  INDICATIONS FOR PROCEDURE:  The patient is a 55 year old gentleman with chronic right wrist pain who has failed conservative care with splinting, anti-inflammatories and activity modifications.  He underwent an MRI scan which did show a lunatotriquetral tear and he presents at this time for surgical intervention.  The risks and benefits were discussed including infection, neurovascular injury, persistent pain, need for additional surgery. The patient states that he understands and wishes to proceed at this time.  DESCRIPTION OF PROCEDURE:  The patient was brought to the operating room 5 after undergoing regional block.  After an adequate level of anesthesia was obtained, the patients right upper extremity was prepped using DuraPrep and draped into a sterile field.  An 18 gauge spinal was inserted to the 3-4 portal after the wrist was placed in fingertrap traction with 10 pounds of distraction.  The joint was infused with half normal saline.  The skin was then incised over the  3-4 portal.  Blunt dissection was carried down to the capsule and a blunt trocar was used to enter the joint.  The scope was inserted and then the 18 gauge needle was inserted into the 6R portal with the legs down and neurovascular structures were identified with the leg from the inside out.  The skin was then incised. Blunt dissection was carried down to the capsule and the trocar was then inserted into the 6-R portal.  The shaver was inserted through the portal.  Visualization showed a significant amount of synovitis both medially and laterally.  There was no osteochondral defects of the joint surface.  There was a significant amount of synovitis at the scapholunate joint and this was debrided.  Vapor Mitek was used for hemostasis.  Examination showed a large tear of the scapholunate ligament. This was also debrided and the  Vapor Mitek was used for hemostasis.  The patient also had a peripheral tear of the triangular fibrocartilage complex and this was also debrided with the Vapor Mitek used for hemostasis.  A survey of the joint showed no other pathology.  The instruments were removed, the portals were closed using 4-0 nylon, the wound covered with Adaptic orthopedic sponges, sterile Webril and a loosely wrapped Coban dressing.  The patient was then taken to PACU in stable condition.  Plan to change the dressing in three days and follow up in the office in two weeks. Dictated by:   Nadara Mustard, M.D. Attending Physician:  Nadara Mustard DD:  05/09/02 TD:  05/13/02 Job: [id number]

## 2011-05-30 ENCOUNTER — Emergency Department (HOSPITAL_COMMUNITY)
Admission: EM | Admit: 2011-05-30 | Discharge: 2011-05-30 | Disposition: A | Discharge status: Home / Self Care | Attending: Emergency Medicine | Admitting: Emergency Medicine

## 2011-05-30 ENCOUNTER — Emergency Department (HOSPITAL_COMMUNITY)

## 2011-05-30 DIAGNOSIS — M25519 Pain in unspecified shoulder: Secondary | ICD-10-CM | POA: Insufficient documentation

## 2011-05-30 DIAGNOSIS — I10 Essential (primary) hypertension: Secondary | ICD-10-CM | POA: Insufficient documentation

## 2011-05-30 DIAGNOSIS — S40019A Contusion of unspecified shoulder, initial encounter: Secondary | ICD-10-CM | POA: Insufficient documentation

## 2011-05-30 DIAGNOSIS — Y990 Civilian activity done for income or pay: Secondary | ICD-10-CM | POA: Insufficient documentation

## 2011-05-30 DIAGNOSIS — W010XXA Fall on same level from slipping, tripping and stumbling without subsequent striking against object, initial encounter: Secondary | ICD-10-CM | POA: Insufficient documentation

## 2011-06-03 ENCOUNTER — Other Ambulatory Visit: Payer: Self-pay | Admitting: Dermatology

## 2011-06-09 ENCOUNTER — Encounter (INDEPENDENT_AMBULATORY_CARE_PROVIDER_SITE_OTHER): Payer: Self-pay | Admitting: General Surgery

## 2011-09-15 LAB — BASIC METABOLIC PANEL
BUN: 11
CO2: 27
Calcium: 9.8
Chloride: 107
Creatinine, Ser: 1.07
GFR calc Af Amer: >60
GFR calc non Af Amer: >60
Glucose, Bld: 93
Potassium: 3.7
Sodium: 141

## 2011-09-15 LAB — DIFFERENTIAL
Basophils Absolute: 0
Basophils Relative: 0
Eosinophils Absolute: 0.1
Eosinophils Relative: 1
Lymphocytes Relative: 22
Lymphs Abs: 2.7
Monocytes Absolute: 0.6
Monocytes Relative: 5
Neutro Abs: 9 — ABNORMAL HIGH
Neutrophils Relative %: 72

## 2011-09-15 LAB — CBC
HCT: 44.8
Hemoglobin: 15.3
MCHC: 34.2
MCV: 91.7
Platelets: 290
RBC: 4.89
RDW: 12.3
WBC: 12.4 — ABNORMAL HIGH

## 2011-09-15 LAB — POCT CARDIAC MARKERS
Myoglobin, poc: 110
Operator id: 1415
Operator id: 1415
Troponin i, poc: <0.05
Troponin i, poc: <0.05

## 2011-09-15 LAB — D-DIMER, QUANTITATIVE: D-Dimer, Quant: 1.37 — ABNORMAL HIGH

## 2011-10-06 ENCOUNTER — Encounter: Payer: Self-pay | Admitting: Internal Medicine

## 2011-10-06 ENCOUNTER — Ambulatory Visit (INDEPENDENT_AMBULATORY_CARE_PROVIDER_SITE_OTHER): Payer: Federal, State, Local not specified - PPO | Admitting: Internal Medicine

## 2011-10-06 ENCOUNTER — Other Ambulatory Visit: Payer: Self-pay | Admitting: Internal Medicine

## 2011-10-06 ENCOUNTER — Other Ambulatory Visit (INDEPENDENT_AMBULATORY_CARE_PROVIDER_SITE_OTHER): Payer: Federal, State, Local not specified - PPO

## 2011-10-06 VITALS — BP 100/70 | HR 72 | Temp 98.2°F | Ht 67.0 in | Wt 179.5 lb

## 2011-10-06 DIAGNOSIS — Z0000 Encounter for general adult medical examination without abnormal findings: Secondary | ICD-10-CM

## 2011-10-06 DIAGNOSIS — E785 Hyperlipidemia, unspecified: Secondary | ICD-10-CM

## 2011-10-06 DIAGNOSIS — F411 Generalized anxiety disorder: Secondary | ICD-10-CM

## 2011-10-06 DIAGNOSIS — R002 Palpitations: Secondary | ICD-10-CM

## 2011-10-06 DIAGNOSIS — Z0001 Encounter for general adult medical examination with abnormal findings: Secondary | ICD-10-CM | POA: Insufficient documentation

## 2011-10-06 DIAGNOSIS — Z23 Encounter for immunization: Secondary | ICD-10-CM

## 2011-10-06 LAB — LIPID PANEL
HDL: 30.6 mg/dL — ABNORMAL LOW (ref >39.00)
VLDL: 80 mg/dL — ABNORMAL HIGH (ref 0.0–40.0)

## 2011-10-06 MED ORDER — ALPRAZOLAM 0.25 MG PO TABS: ORAL_TABLET | ORAL | Status: DC

## 2011-10-06 NOTE — Assessment & Plan Note (Addendum)
stable overall by hx and exam, most recent data reviewed with pt, and pt to continue medical treatment as before, declines SSRI trial or counseling Lab Results  Component Value Date   WBC 11.9* 01/04/2011   HGB 16.1 01/04/2011   HCT 46.8 01/04/2011   PLT 282.0 01/04/2011   GLUCOSE 79 01/04/2011   CHOL 222* 01/04/2011   TRIG 122.0 01/04/2011   HDL 31.90* 01/04/2011   LDLDIRECT 173.5 01/04/2011   ALT 33 01/04/2011   AST 24 01/04/2011   NA 139 01/04/2011   K 4.5 01/04/2011   CL 104 01/04/2011   CREATININE 1.1 01/04/2011   BUN 15 01/04/2011   CO2 29 01/04/2011   TSH 1.28 01/04/2011   PSA 1.00 01/04/2011

## 2011-10-06 NOTE — Assessment & Plan Note (Signed)
Improved symptoms with increased metroprolol to 25 bid without apparent side effect, but pt advised not to change med dosing, particularly with this type of medication in order to avoid inadvertant adverse reaction

## 2011-10-06 NOTE — Assessment & Plan Note (Signed)
stable overall by hx and exam, and pt to continue medical treatment as before, cont diet, pt requests f/u lipids in light of renewed efforts at diet

## 2011-10-06 NOTE — Progress Notes (Signed)
  Subjective:    Patient ID: Martin [facility], male    DOB: [birth year], 55 y.o.   MRN: [id number]  HPI Here to f/u; Denies worsening depressive symptoms, suicidal ideation, or panic, though has ongoing anxiety, not increased recently.  Pt denies chest pain, increased sob or doe, wheezing, orthopnea, PND, increased LE swelling, palpitations, dizziness or syncope.  In fact, Now taking metoprolol 25 bid (up from half bid) with improved episodes palpiations.  Has quit smoking, trying to follow lower chol diet -asks for lab.  Also s/p recent left rot cuff surgury per Dr Philis Kendall sept 12, for PT to start next wk . Pt denies new neurological symptoms such as new headache, or facial or extremity weakness or numbness   Pt denies polydipsia, polyuria Past Medical History  Diagnosis Date  . Rhinitis, allergic     skin test pos 11/26/08- dust,cat  . Rhinosinusitis     recurrent  . Asthma     chilhood   Past Surgical History  Procedure Date  . Left knee   . Right wrist     reports that he quit smoking about 5 months ago. His smoking use included Cigarettes. He has a 52.5 pack-year smoking history. He does not have any smokeless tobacco history on file. He reports that he uses illicit drugs (Marijuana). He reports that he does not drink alcohol. family history includes Colon cancer in an unspecified family member; Heart disease in his father; Hypertension in his mother; Lung cancer in an unspecified family member; Other in an unspecified family member; and Thyroid disease in his mother. No Known Allergies Current Outpatient Prescriptions on File Prior to Visit  Medication Sig Dispense Refill  . metoprolol tartrate (LOPRESSOR) 25 MG tablet Take 1/2 tablet twice a day  30 tablet  11   ,Review of Systems Review of Systems  Constitutional: Negative for diaphoresis and unexpected weight change.  HENT: Negative for drooling and tinnitus.   Eyes: Negative for photophobia and visual disturbance.  Respiratory:  Negative for choking and stridor.   Gastrointestinal: Negative for vomiting and blood in stool.        Objective:   Physical Exam BP 100/70  Pulse 72  Temp(Src) 98.2 F (36.8 C) (Oral)  Ht 5\' 7"  (1.702 m)  Wt 179 lb 8 oz (81.421 kg)  BMI 28.11 kg/m2  SpO2 98% Physical Exam  VS noted Constitutional: Pt appears well-developed and well-nourished.  HENT: Head: Normocephalic.  Right Ear: External ear normal.  Left Ear: External ear normal.  Eyes: Conjunctivae and EOM are normal. Pupils are equal, round, and reactive to light.  Neck: Normal range of motion. Neck supple.  Cardiovascular: Normal rate and regular rhythm.   Pulmonary/Chest: Effort normal and breath sounds normal.  Abd:  Soft, NT, non-distended, + BS Neurological: Pt is alert. No cranial nerve deficit.  Skin: Skin is warm. No erythema.  Psychiatric: Pt behavior is normal. Thought content normal. 1-2+ nervous    Assessment & Plan:

## 2011-10-06 NOTE — Patient Instructions (Addendum)
You had the flu shot today Please go to LAB in the Basement for the blood and/or urine tests to be done today Please call the phone number [phone] (the [employer]Tree System) for results of testing in 2-3 days;  When calling, simply dial the number, and when prompted enter the MRN number above (the Medical Record Number) and the # key, then the message should start. Please keep your appointments with your specialists as you have planned - Nov 8 with cardiology Please return in 6 mo with Lab testing done 3-5 days before

## 2011-10-07 LAB — LDL CHOLESTEROL, DIRECT: Direct LDL: 109.5 mg/dL

## 2011-10-20 ENCOUNTER — Encounter: Payer: Self-pay | Admitting: Cardiology

## 2011-10-20 ENCOUNTER — Ambulatory Visit (INDEPENDENT_AMBULATORY_CARE_PROVIDER_SITE_OTHER): Payer: Federal, State, Local not specified - PPO | Admitting: Cardiology

## 2011-10-20 VITALS — BP 124/84 | HR 68 | Ht 67.0 in | Wt 173.0 lb

## 2011-10-20 DIAGNOSIS — F17200 Nicotine dependence, unspecified, uncomplicated: Secondary | ICD-10-CM

## 2011-10-20 DIAGNOSIS — I2510 Atherosclerotic heart disease of native coronary artery without angina pectoris: Secondary | ICD-10-CM

## 2011-10-20 DIAGNOSIS — E781 Pure hyperglyceridemia: Secondary | ICD-10-CM

## 2011-10-20 DIAGNOSIS — R002 Palpitations: Secondary | ICD-10-CM

## 2011-10-20 MED ORDER — METOPROLOL TARTRATE 25 MG PO TABS: 25.0000 mg | ORAL_TABLET | Freq: Two times a day (BID) | ORAL | Status: DC

## 2011-10-20 NOTE — Assessment & Plan Note (Signed)
He can remain on the meds as listed.  No change in therapy is indicated.

## 2011-10-20 NOTE — Patient Instructions (Signed)
The current medical regimen is effective;  continue present plan and medications.  Follow up in 1 year with Dr Hochrein.  You will receive a letter in the mail 2 months before you are due.  Please call us when you receive this letter to schedule your follow up appointment.  

## 2011-10-20 NOTE — Assessment & Plan Note (Signed)
He remains off of cigarettes and I applaud this.

## 2011-10-20 NOTE — Assessment & Plan Note (Signed)
His triglycerides were 400 at the last visit.  He will stop the frequent milkshakes at "[facility]".

## 2011-10-20 NOTE — Progress Notes (Signed)
HPI The patient presents for evaluation of palpitations.  He has had rotator cuff injury and repair.  He has had significant pain with this.  He has been somewhat inactive.  He has had palpitations and increased his beta blocker.  We had discussed doing this at the last appt.  He says this increased does helps. He also takes an occasional Xanax.  The patient denies any new symptoms such as chest discomfort, neck or arm discomfort. There has been no new shortness of breath, PND or orthopnea. There have been no reported presyncope or syncope.  No Known Allergies  Current Outpatient Prescriptions  Medication Sig Dispense Refill  . ALPRAZolam (XANAX) 0.25 MG tablet 1 every 6 hrs as needed for heart racing  30 tablet  1  . metoprolol tartrate (LOPRESSOR) 25 MG tablet Take 25 mg by mouth 2 (two) times daily.          Past Medical History  Diagnosis Date  . Rhinitis, allergic     skin test pos 11/26/08- dust,cat  . Rhinosinusitis     recurrent  . Asthma     chilhood    Past Surgical History  Procedure Date  . Left knee   . Right wrist   . Rotator cuff repair     left    ROS:  As stated in the HPI and negative for all other systems.  PHYSICAL EXAM BP 124/84  Pulse 68  Ht 5\' 7"  (1.702 m)  Wt 173 lb (78.472 kg)  BMI 27.10 kg/m2 GENERAL:  Well appearing NECK:  No jugular venous distention, waveform within normal limits, carotid upstroke brisk and symmetric, no bruits, no thyromegaly LYMPHATICS:  No cervical, inguinal adenopathy LUNGS:  Clear to auscultation bilaterally BACK:  No CVA tenderness CHEST:  Unremarkable HEART:  PMI not displaced or sustained,S1 and S2 within normal limits, no S3, no S4, no clicks, no rubs, no murmurs ABD:  Flat, positive bowel sounds normal in frequency in pitch, no bruits, no rebound, no guarding, no midline pulsatile mass, no hepatomegaly, no splenomegaly EXT:  2 plus pulses throughout, no edema, no cyanosis no clubbing NEURO:  Cranial nerves II through  XII grossly intact, motor grossly intact throughout PSYCH:  Cognitively intact, oriented to person place and time  ASSESSMENT AND PLAN

## 2011-11-03 ENCOUNTER — Ambulatory Visit: Payer: Self-pay | Admitting: Cardiology

## 2012-03-15 ENCOUNTER — Other Ambulatory Visit: Payer: Self-pay | Admitting: Dermatology

## 2012-04-17 ENCOUNTER — Other Ambulatory Visit: Payer: Self-pay | Admitting: Dermatology

## 2012-09-18 ENCOUNTER — Telehealth: Payer: Self-pay

## 2012-09-18 DIAGNOSIS — Z0000 Encounter for general adult medical examination without abnormal findings: Secondary | ICD-10-CM

## 2012-09-18 NOTE — Telephone Encounter (Signed)
Put lab order in for physical labs. 

## 2012-09-20 ENCOUNTER — Ambulatory Visit (INDEPENDENT_AMBULATORY_CARE_PROVIDER_SITE_OTHER): Payer: Federal, State, Local not specified - PPO

## 2012-09-20 DIAGNOSIS — Z23 Encounter for immunization: Secondary | ICD-10-CM

## 2012-10-12 ENCOUNTER — Other Ambulatory Visit (INDEPENDENT_AMBULATORY_CARE_PROVIDER_SITE_OTHER): Payer: Federal, State, Local not specified - PPO

## 2012-10-12 DIAGNOSIS — Z0000 Encounter for general adult medical examination without abnormal findings: Secondary | ICD-10-CM

## 2012-10-12 LAB — LIPID PANEL
Cholesterol: 194 mg/dL (ref 0–200)
Triglycerides: 133 mg/dL (ref 0.0–149.0)
VLDL: 26.6 mg/dL (ref 0.0–40.0)

## 2012-10-12 LAB — URINALYSIS, ROUTINE W REFLEX MICROSCOPIC
Ketones, ur: NEGATIVE
Specific Gravity, Urine: 1.02 (ref 1.000–1.030)
Urine Glucose: NEGATIVE
Urobilinogen, UA: 0.2 (ref 0.0–1.0)
pH: 6.5 (ref 5.0–8.0)

## 2012-10-12 LAB — CBC WITH DIFFERENTIAL/PLATELET
Basophils Relative: 0.8 % (ref 0.0–3.0)
Eosinophils Relative: 3.6 % (ref 0.0–5.0)
Lymphocytes Relative: 41.2 % (ref 12.0–46.0)
Monocytes Absolute: 0.7 10*3/uL (ref 0.1–1.0)
Monocytes Relative: 8.4 % (ref 3.0–12.0)
Neutrophils Relative %: 46 % (ref 43.0–77.0)
Platelets: 246 10*3/uL (ref 150.0–400.0)
RBC: 4.46 Mil/uL (ref 4.22–5.81)
WBC: 8.8 10*3/uL (ref 4.5–10.5)

## 2012-10-12 LAB — BASIC METABOLIC PANEL
BUN: 16 mg/dL (ref 6–23)
CO2: 31 mEq/L (ref 19–32)
Chloride: 103 mEq/L (ref 96–112)
GFR: 72.95 mL/min (ref >60.00)
Glucose, Bld: 83 mg/dL (ref 70–99)
Potassium: 4.4 mEq/L (ref 3.5–5.1)
Sodium: 139 mEq/L (ref 135–145)

## 2012-10-19 ENCOUNTER — Ambulatory Visit (INDEPENDENT_AMBULATORY_CARE_PROVIDER_SITE_OTHER): Payer: Federal, State, Local not specified - PPO | Admitting: Cardiology

## 2012-10-19 ENCOUNTER — Encounter: Payer: Self-pay | Admitting: Cardiology

## 2012-10-19 VITALS — BP 133/89 | HR 87 | Ht 67.0 in | Wt 182.8 lb

## 2012-10-19 DIAGNOSIS — E785 Hyperlipidemia, unspecified: Secondary | ICD-10-CM

## 2012-10-19 DIAGNOSIS — F17200 Nicotine dependence, unspecified, uncomplicated: Secondary | ICD-10-CM

## 2012-10-19 DIAGNOSIS — I2510 Atherosclerotic heart disease of native coronary artery without angina pectoris: Secondary | ICD-10-CM

## 2012-10-19 DIAGNOSIS — E781 Pure hyperglyceridemia: Secondary | ICD-10-CM

## 2012-10-19 MED ORDER — METOPROLOL TARTRATE 25 MG PO TABS: 25.0000 mg | ORAL_TABLET | Freq: Two times a day (BID) | ORAL | Status: DC

## 2012-10-19 NOTE — Patient Instructions (Addendum)
The current medical regimen is effective;  continue present plan and medications.  Follow up in 1 year with Dr Hochrein.  You will receive a letter in the mail 2 months before you are due.  Please call us when you receive this letter to schedule your follow up appointment.  

## 2012-10-19 NOTE — Progress Notes (Signed)
   HPI The patient presents for evaluation of palpitations. Since I last saw him he has done well. He says that he rarely takes Xanax.  He has few palpitations. The patient denies any new symptoms such as chest discomfort, neck or arm discomfort. There has been no new shortness of breath, PND or orthopnea. There have been no reported presyncope or syncope.  No Known Allergies  Current Outpatient Prescriptions  Medication Sig Dispense Refill  . ALPRAZolam (XANAX) 0.25 MG tablet 1 every 6 hrs as needed for heart racing  30 tablet  1  . metoprolol tartrate (LOPRESSOR) 25 MG tablet Take 1 tablet (25 mg total) by mouth 2 (two) times daily.  60 tablet  11    Past Medical History  Diagnosis Date  . Rhinitis, allergic     skin test pos 11/26/08- dust,cat  . Rhinosinusitis     recurrent  . Asthma     chilhood    Past Surgical History  Procedure Date  . Left knee   . Right wrist   . Rotator cuff repair     left    ROS:  As stated in the HPI and negative for all other systems.  PHYSICAL EXAM BP 133/89  Pulse 87  Ht 5\' 7"  (1.702 m)  Wt 182 lb 12.8 oz (82.918 kg)  BMI 28.63 kg/m2 GENERAL:  Well appearing NECK:  No jugular venous distention, waveform within normal limits, carotid upstroke brisk and symmetric, no bruits, no thyromegaly LUNGS:  Clear to auscultation bilaterally CHEST:  Unremarkable HEART:  PMI not displaced or sustained,S1 and S2 within normal limits, no S3, no S4, no clicks, no rubs, no murmurs ABD:  Flat, positive bowel sounds normal in frequency in pitch, no bruits, no rebound, no guarding, no midline pulsatile mass, no hepatomegaly, no splenomegaly EXT:  2 plus pulses throughout, no edema, no cyanosis no clubbing  EKG:  Sinus rhythm, rate 87, axis within normal limits, intervals within normal limits, no acute ST-T wave changes.  ASSESSMENT AND PLAN  Palpitations -  The patient has had no new palpitations.  No change in therapy is indicated.   TOBACCO USE  DISORDER -  He remains off of cigarettes.  We discussed other healthy lifestyle choices, in particular exercise.

## 2012-10-22 ENCOUNTER — Ambulatory Visit (INDEPENDENT_AMBULATORY_CARE_PROVIDER_SITE_OTHER): Payer: Federal, State, Local not specified - PPO | Admitting: Internal Medicine

## 2012-10-22 ENCOUNTER — Encounter: Payer: Self-pay | Admitting: Internal Medicine

## 2012-10-22 VITALS — BP 120/82 | HR 60 | Temp 98.2°F | Ht 67.0 in | Wt 182.4 lb

## 2012-10-22 DIAGNOSIS — B009 Herpesviral infection, unspecified: Secondary | ICD-10-CM

## 2012-10-22 DIAGNOSIS — Z0000 Encounter for general adult medical examination without abnormal findings: Secondary | ICD-10-CM

## 2012-10-22 DIAGNOSIS — E781 Pure hyperglyceridemia: Secondary | ICD-10-CM

## 2012-10-22 DIAGNOSIS — B001 Herpesviral vesicular dermatitis: Secondary | ICD-10-CM

## 2012-10-22 DIAGNOSIS — J309 Allergic rhinitis, unspecified: Secondary | ICD-10-CM

## 2012-10-22 DIAGNOSIS — Z23 Encounter for immunization: Secondary | ICD-10-CM

## 2012-10-22 MED ORDER — FLUTICASONE PROPIONATE 50 MCG/ACT NA SUSP: 2.0000 | Freq: Every day | NASAL | Status: DC

## 2012-10-22 MED ORDER — VALACYCLOVIR HCL 1 G PO TABS: 1000.0000 mg | ORAL_TABLET | Freq: Three times a day (TID) | ORAL | Status: DC

## 2012-10-22 NOTE — Assessment & Plan Note (Signed)
For lipids, consider fibrate for persistent elev TG

## 2012-10-22 NOTE — Assessment & Plan Note (Signed)

## 2012-10-22 NOTE — Assessment & Plan Note (Signed)
For valtrx asd prn

## 2012-10-22 NOTE — Patient Instructions (Addendum)
Continue all other medications as before Please have the pharmacy call with any refills you may need. Please continue your efforts at being more active, low cholesterol diet, and weight control. Please call if you change your mind about the lipitor 10 mg per day, or else OK to check lipids in 6 months You will be contacted by phone if any changes need to be made immediately.  Otherwise, you will receive a letter about your results with an explanation. Please remember to sign up for My Chart at your earliest convenience, as this will be important to you in the future with finding out test results. You had the tetanus shot today Please keep your appointments with your specialists as you have planned  - cardiology Please return in 1 year for your yearly visit, or sooner if needed, with Lab testing done 3-5 days before

## 2012-10-22 NOTE — Progress Notes (Signed)
Subjective:    Patient ID: Martin Walker, male    DOB: [birth year], 56 y.o.   MRN: [id number]  HPI  Here for wellness and f/u;  Overall doing ok;  Pt denies CP, worsening SOB, DOE, wheezing, orthopnea, PND, worsening LE edema, palpitations, dizziness or syncope.  Pt denies neurological change such as new Headache, facial or extremity weakness.  Pt denies polydipsia, polyuria, or low sugar symptoms. Pt states overall good compliance with treatment and medications, good tolerability, and trying to follow lower cholesterol diet.  Pt denies worsening depressive symptoms, suicidal ideation or panic. No fever, wt loss, night sweats, loss of appetite, or other constitutional symptoms.  Pt states good ability with ADL's, low fall risk, home safety reviewed and adequate, no significant changes in hearing or vision, and occasionally active with exercise, little actually for about a yr due to left shoudler, now s/p surgury, plans to be more active. No acute complaints.  No panic.  Takes claritin D occasionally with some urinary retention.  Past Medical History  Diagnosis Date  . Rhinitis, allergic     skin test pos 11/26/08- dust,cat  . Rhinosinusitis     recurrent  . Asthma     chilhood   Past Surgical History  Procedure Date  . Left knee   . Right wrist   . Rotator cuff repair     left    reports that he quit smoking about 17 months ago. His smoking use included Cigarettes. He has a 52.5 pack-year smoking history. He does not have any smokeless tobacco history on file. He reports that he uses illicit drugs (Marijuana). He reports that he does not drink alcohol. family history includes Colon cancer in an unspecified family member; Heart disease in his father; Hypertension in his mother; Lung cancer in an unspecified family member; Other in an unspecified family member; and Thyroid disease in his mother. No Known Allergies Current Outpatient Prescriptions on File Prior to Visit  Medication Sig  Dispense Refill  . ALPRAZolam (XANAX) 0.25 MG tablet 1 every 6 hrs as needed for heart racing  30 tablet  1  . metoprolol tartrate (LOPRESSOR) 25 MG tablet Take 1 tablet (25 mg total) by mouth 2 (two) times daily.  180 tablet  3   Review of Systems Review of Systems  Constitutional: Negative for diaphoresis, activity change, appetite change and unexpected weight change.  HENT: Negative for hearing loss, ear pain, facial swelling, mouth sores and neck stiffness.   Eyes: Negative for pain, redness and visual disturbance.  Respiratory: Negative for shortness of breath and wheezing.   Cardiovascular: Negative for chest pain and palpitations.  Gastrointestinal: Negative for diarrhea, blood in stool, abdominal distention and rectal pain.  Genitourinary: Negative for hematuria, flank pain and decreased urine volume.  Musculoskeletal: Negative for myalgias and joint swelling.  Skin: Negative for color change and wound.  Neurological: Negative for syncope and numbness.  Hematological: Negative for adenopathy.  Psychiatric/Behavioral: Negative for hallucinations, self-injury, decreased concentration and agitation.      Objective:   Physical Exam BP 120/82  Pulse 60  Temp 98.2 F (36.8 C) (Oral)  Ht 5\' 7"  (1.702 m)  Wt 182 lb 6 oz (82.725 kg)  BMI 28.56 kg/m2  SpO2 98% Physical Exam  VS noted Constitutional: Pt is oriented to person, place, and time. Appears well-developed and well-nourished.  HENT:  Head: Normocephalic and atraumatic.  Right Ear: External ear normal.  Left Ear: External ear normal.  Nose: Nose normal.  Mouth/Throat:  Oropharynx is clear and moist.  Bilat tm's mild erythema.  Sinus nontender.  Pharynx mild erythema Eyes: Conjunctivae and EOM are normal. Pupils are equal, round, and reactive to light.  Neck: Normal range of motion. Neck supple. No JVD present. No tracheal deviation present.  Cardiovascular: Normal rate, regular rhythm, normal heart sounds and intact  distal pulses.   Pulmonary/Chest: Effort normal and breath sounds normal.  Abdominal: Soft. Bowel sounds are normal. There is no tenderness.  Musculoskeletal: Normal range of motion. Exhibits no edema.  Lymphadenopathy:  Has no cervical adenopathy.  Neurological: Pt is alert and oriented to person, place, and time. Pt has normal reflexes. No cranial nerve deficit.  Skin: Skin is warm and dry. No rash noted.  Psychiatric:  Has  normal mood and affect. Behavior is normal.     Assessment & Plan:

## 2012-10-22 NOTE — Assessment & Plan Note (Signed)
To avoid decongestant due to prostatism mild, cont claritin ok, and for flonase asd prn

## 2012-11-08 ENCOUNTER — Encounter: Payer: Self-pay | Admitting: Internal Medicine

## 2012-11-08 ENCOUNTER — Ambulatory Visit (INDEPENDENT_AMBULATORY_CARE_PROVIDER_SITE_OTHER): Payer: Federal, State, Local not specified - PPO | Admitting: Internal Medicine

## 2012-11-08 ENCOUNTER — Other Ambulatory Visit (INDEPENDENT_AMBULATORY_CARE_PROVIDER_SITE_OTHER): Payer: Federal, State, Local not specified - PPO

## 2012-11-08 VITALS — BP 112/72 | HR 64 | Temp 98.0°F | Ht 67.0 in | Wt 182.0 lb

## 2012-11-08 DIAGNOSIS — R310 Gross hematuria: Secondary | ICD-10-CM

## 2012-11-08 DIAGNOSIS — F411 Generalized anxiety disorder: Secondary | ICD-10-CM

## 2012-11-08 DIAGNOSIS — R10A1 Flank pain, right side: Secondary | ICD-10-CM | POA: Insufficient documentation

## 2012-11-08 DIAGNOSIS — R109 Unspecified abdominal pain: Secondary | ICD-10-CM

## 2012-11-08 LAB — CBC WITH DIFFERENTIAL/PLATELET
Basophils Relative: 0.5 % (ref 0.0–3.0)
Eosinophils Relative: 1.8 % (ref 0.0–5.0)
Hemoglobin: 15.6 g/dL (ref 13.0–17.0)
Lymphocytes Relative: 31.6 % (ref 12.0–46.0)
MCV: 92.6 fl (ref 78.0–100.0)
Monocytes Absolute: 0.9 10*3/uL (ref 0.1–1.0)
Neutro Abs: 5.1 10*3/uL (ref 1.4–7.7)
Neutrophils Relative %: 56.4 % (ref 43.0–77.0)
RBC: 5.05 Mil/uL (ref 4.22–5.81)
WBC: 9.1 10*3/uL (ref 4.5–10.5)

## 2012-11-08 LAB — BASIC METABOLIC PANEL
BUN: 16 mg/dL (ref 6–23)
Calcium: 9.5 mg/dL (ref 8.4–10.5)
Chloride: 102 mEq/L (ref 96–112)
Creatinine, Ser: 1.2 mg/dL (ref 0.4–1.5)

## 2012-11-08 LAB — URINALYSIS, ROUTINE W REFLEX MICROSCOPIC
Leukocytes, UA: NEGATIVE
Specific Gravity, Urine: 1.015 (ref 1.000–1.030)
Urobilinogen, UA: 0.2 (ref 0.0–1.0)

## 2012-11-08 LAB — HEPATIC FUNCTION PANEL
ALT: 38 U/L (ref 0–53)
Total Bilirubin: 0.9 mg/dL (ref 0.3–1.2)

## 2012-11-08 MED ORDER — CYCLOBENZAPRINE HCL 5 MG PO TABS: 5.0000 mg | ORAL_TABLET | Freq: Three times a day (TID) | ORAL | Status: DC | PRN

## 2012-11-08 NOTE — Assessment & Plan Note (Signed)
Diff includes renal contusion related to recent leaf blower use as per HPI, cant r/o infection , stone or malignancy as well - for lab including urine studies today, for CT after renal fxn checked, urology referral, for flexeril prn muscle spasm o/w hold other specific tx

## 2012-11-08 NOTE — Patient Instructions (Addendum)
Take all new medications as prescribed Continue all other medications as before Before leaving today, please see the [facility] regarding having the CT scan done tomorrow Please go to LAB in the Basement for the blood and/or urine tests to be done today You will be contacted by phone if any changes need to be made immediately.  Otherwise, you will receive a letter about your results with an explanation, but please try Mychart first Thank you for enrolling in MyChart. Please follow the instructions below to securely access your online medical record. MyChart allows you to send messages to your doctor, view your test results, renew your prescriptions, schedule appointments, and more. Your username is [email or web address] You are given the work note today  Remember, your Lab work and CT scans will be on Mychart fairly soon (within 1-2 days) if you are not called first

## 2012-11-08 NOTE — Assessment & Plan Note (Signed)
C/w msk spasm, for flexeril prn,  to f/u any worsening symptoms or concerns 

## 2012-11-08 NOTE — Progress Notes (Signed)
Subjective:    Patient ID: Martin Walker, male    DOB: 1956-03-09, 56 y.o.   MRN: [id number]  HPI  Here to f/u with c/o gross hematuria with yellow/orange (more concentrated with less fluids in the last few days) but also red at end urination, worse in the past few days with more red, began drinking more fluid yesterday and no blood this am.  Wore backpack style leaf blower for 6 hours with hitting the right flank area during that time x 5 days ago, and still markedly tender.  Quit smoking 1.5 yrs ago - prior 1/2 ppd for 40 yrs.   Pt denies fever, wt loss, night sweats, loss of appetite, or other constitutional symptoms, though may have felt some mild feverish for 5 min last night. No chills.  + hx of renal stone x 1 (about 16 yrs ago). No blood thinner use.  Pt denies chest pain, increased sob or doe, wheezing, orthopnea, PND, increased LE swelling, palpitations, dizziness or syncope.   Pt denies polydipsia, polyuria.  Pt denies new neurological symptoms such as new headache, or facial or extremity weakness or numbness  Denies worsening depressive symptoms, suicidal ideation, or panic Past Medical History  Diagnosis Date  . Rhinitis, allergic     skin test pos 11/26/08- dust,cat  . Rhinosinusitis     recurrent  . Asthma     chilhood   Past Surgical History  Procedure Date  . Left knee   . Right wrist   . Rotator cuff repair     left    reports that he quit smoking about 18 months ago. His smoking use included Cigarettes. He has a 52.5 pack-year smoking history. He does not have any smokeless tobacco history on file. He reports that he uses illicit drugs (Marijuana). He reports that he does not drink alcohol. family history includes Colon cancer in an unspecified family member; Heart disease in his father; Hypertension in his mother; Lung cancer in an unspecified family member; Other in an unspecified family member; and Thyroid disease in his mother. No Known Allergies Current  Outpatient Prescriptions on File Prior to Visit  Medication Sig Dispense Refill  . ALPRAZolam (XANAX) 0.25 MG tablet 1 every 6 hrs as needed for heart racing  30 tablet  1  . fluticasone (FLONASE) 50 MCG/ACT nasal spray Place 2 sprays into the nose daily.  16 g  5  . metoprolol tartrate (LOPRESSOR) 25 MG tablet Take 1 tablet (25 mg total) by mouth 2 (two) times daily.  180 tablet  3  . valACYclovir (VALTREX) 1000 MG tablet Take 1 tablet (1,000 mg total) by mouth 3 (three) times daily. As needed for cold sore episodes  21 tablet  3   aReview of Systems  Constitutional: Negative for diaphoresis and unexpected weight change.  HENT: Negative for tinnitus.   Eyes: Negative for photophobia and visual disturbance.  Respiratory: Negative for choking and stridor.   Gastrointestinal: Negative for vomiting and blood in stool.  Genitourinary: Negative for hematuria and decreased urine volume.  Musculoskeletal: Negative for gait problem.  Skin: Negative for color change and wound.  Neurological: Negative for tremors and numbness.  Psychiatric/Behavioral: Negative for decreased concentration. The patient is not hyperactive.       Objective:   Physical Exam BP 112/72  Pulse 64  Temp 98 F (36.7 C) (Oral)  Ht 5\' 7"  (1.702 m)  Wt 182 lb (82.555 kg)  BMI 28.51 kg/m2  SpO2 98% Physical Exam  VS  noted Constitutional: Pt appears well-developed and well-nourished.  HENT: Head: Normocephalic.  Right Ear: External ear normal.  Left Ear: External ear normal.  Eyes: Conjunctivae and EOM are normal. Pupils are equal, round, and reactive to light.  Neck: Normal range of motion. Neck supple.  Cardiovascular: Normal rate and regular rhythm.   Pulmonary/Chest: Effort normal and breath sounds normal.  Abd:  Soft, NT, non-distended, + BS, but has right flank diffuse mild tender and muscular spasm/swelling nondiscrete but rather large area Neurological: Pt is alert. Not confused , motor/gait intact Skin: Skin  is warm. No erythema. No LE edema Psychiatric: Pt behavior is normal. Thought content normal. Mild nervous at best    Assessment & Plan:

## 2012-11-08 NOTE — Assessment & Plan Note (Signed)
stable overall by hx and exam, most recent data reviewed with pt, and pt to continue medical treatment as before Lab Results  Component Value Date   WBC 9.1 11/08/2012   HGB 15.6 11/08/2012   HCT 46.8 11/08/2012   PLT 284.0 11/08/2012   GLUCOSE 87 11/08/2012   CHOL 194 10/12/2012   TRIG 133.0 10/12/2012   HDL 29.30* 10/12/2012   LDLDIRECT 109.5 10/06/2011   LDLCALC 138* 10/12/2012   ALT 38 11/08/2012   AST 27 11/08/2012   NA 139 11/08/2012   K 4.7 11/08/2012   CL 102 11/08/2012   CREATININE 1.2 11/08/2012   BUN 16 11/08/2012   CO2 27 11/08/2012   TSH 0.92 10/12/2012   PSA 1.13 10/12/2012

## 2012-11-09 ENCOUNTER — Ambulatory Visit (INDEPENDENT_AMBULATORY_CARE_PROVIDER_SITE_OTHER)
Admission: RE | Admit: 2012-11-09 | Discharge: 2012-11-09 | Disposition: A | Discharge status: Home / Self Care | Payer: Federal, State, Local not specified - PPO | Source: Ambulatory Visit | Attending: Internal Medicine | Admitting: Internal Medicine

## 2012-11-09 DIAGNOSIS — R310 Gross hematuria: Secondary | ICD-10-CM

## 2012-11-09 DIAGNOSIS — R109 Unspecified abdominal pain: Secondary | ICD-10-CM

## 2012-11-09 LAB — URINE CULTURE: Organism ID, Bacteria: NO GROWTH

## 2012-11-09 MED ORDER — IOHEXOL 300 MG/ML  SOLN
100.0000 mL | Freq: Once | INTRAMUSCULAR | Status: AC | PRN
  Administered 2012-11-09: 100 mL via INTRAVENOUS

## 2013-10-10 ENCOUNTER — Telehealth: Payer: Self-pay

## 2013-10-10 ENCOUNTER — Ambulatory Visit (INDEPENDENT_AMBULATORY_CARE_PROVIDER_SITE_OTHER): Payer: Federal, State, Local not specified - PPO

## 2013-10-10 DIAGNOSIS — Z23 Encounter for immunization: Secondary | ICD-10-CM

## 2013-10-10 MED ORDER — FLUTICASONE PROPIONATE 50 MCG/ACT NA SUSP: 2.0000 | Freq: Every day | NASAL | Status: DC

## 2013-10-10 NOTE — Telephone Encounter (Signed)
Patient was seen today during the flu clinic. While here patient stated that he needed a refill for Flonase 50 mcg. Medication was last refilled 10/22/2012 and patient was last seen 11/08/2012. Please advise.

## 2013-11-06 ENCOUNTER — Other Ambulatory Visit: Payer: Self-pay | Admitting: *Deleted

## 2013-11-06 DIAGNOSIS — I2510 Atherosclerotic heart disease of native coronary artery without angina pectoris: Secondary | ICD-10-CM

## 2013-11-06 MED ORDER — METOPROLOL TARTRATE 25 MG PO TABS: 25.0000 mg | ORAL_TABLET | Freq: Two times a day (BID) | ORAL | Status: DC

## 2013-11-28 ENCOUNTER — Ambulatory Visit: Payer: Federal, State, Local not specified - PPO | Admitting: Cardiology

## 2013-12-11 ENCOUNTER — Encounter: Payer: Self-pay | Admitting: Gastroenterology

## 2013-12-11 ENCOUNTER — Encounter: Payer: Self-pay | Admitting: Internal Medicine

## 2013-12-11 ENCOUNTER — Ambulatory Visit (INDEPENDENT_AMBULATORY_CARE_PROVIDER_SITE_OTHER): Payer: Federal, State, Local not specified - PPO | Admitting: Internal Medicine

## 2013-12-11 ENCOUNTER — Other Ambulatory Visit (INDEPENDENT_AMBULATORY_CARE_PROVIDER_SITE_OTHER): Payer: Federal, State, Local not specified - PPO

## 2013-12-11 VITALS — BP 118/82 | HR 60 | Temp 97.4°F | Ht 67.0 in | Wt 174.0 lb

## 2013-12-11 DIAGNOSIS — Z0000 Encounter for general adult medical examination without abnormal findings: Secondary | ICD-10-CM

## 2013-12-11 DIAGNOSIS — R109 Unspecified abdominal pain: Secondary | ICD-10-CM

## 2013-12-11 DIAGNOSIS — I2510 Atherosclerotic heart disease of native coronary artery without angina pectoris: Secondary | ICD-10-CM

## 2013-12-11 LAB — URINALYSIS, ROUTINE W REFLEX MICROSCOPIC
Leukocytes, UA: NEGATIVE
Nitrite: NEGATIVE
Specific Gravity, Urine: <=1.005 — AB (ref 1.000–1.030)
pH: 6 (ref 5.0–8.0)

## 2013-12-11 LAB — CBC WITH DIFFERENTIAL/PLATELET
Eosinophils Relative: 2.7 % (ref 0.0–5.0)
Lymphocytes Relative: 40.2 % (ref 12.0–46.0)
Monocytes Relative: 8.6 % (ref 3.0–12.0)
Neutrophils Relative %: 47.8 % (ref 43.0–77.0)
Platelets: 255 10*3/uL (ref 150.0–400.0)
WBC: 7.9 10*3/uL (ref 4.5–10.5)

## 2013-12-11 LAB — LIPID PANEL
Cholesterol: 212 mg/dL — ABNORMAL HIGH (ref 0–200)
HDL: 33.5 mg/dL — ABNORMAL LOW (ref >39.00)

## 2013-12-11 LAB — TSH: TSH: 1.17 u[IU]/mL (ref 0.35–5.50)

## 2013-12-11 LAB — HEPATIC FUNCTION PANEL
ALT: 33 U/L (ref 0–53)
AST: 24 U/L (ref 0–37)
Albumin: 4.6 g/dL (ref 3.5–5.2)
Total Protein: 7.6 g/dL (ref 6.0–8.3)

## 2013-12-11 LAB — BASIC METABOLIC PANEL
BUN: 16 mg/dL (ref 6–23)
Chloride: 102 mEq/L (ref 96–112)
GFR: 83.87 mL/min (ref >60.00)
Glucose, Bld: 94 mg/dL (ref 70–99)
Potassium: 5 mEq/L (ref 3.5–5.1)

## 2013-12-11 LAB — PSA: PSA: 1.26 ng/mL (ref 0.10–4.00)

## 2013-12-11 MED ORDER — ALPRAZOLAM 0.25 MG PO TABS: ORAL_TABLET | ORAL | Status: DC

## 2013-12-11 MED ORDER — METOPROLOL TARTRATE 25 MG PO TABS: 25.0000 mg | ORAL_TABLET | Freq: Two times a day (BID) | ORAL | Status: DC

## 2013-12-11 MED ORDER — CYCLOBENZAPRINE HCL 5 MG PO TABS: 5.0000 mg | ORAL_TABLET | Freq: Three times a day (TID) | ORAL | Status: DC | PRN

## 2013-12-11 NOTE — Progress Notes (Signed)
Subjective:    Patient ID: Martin Walker, male    DOB: 1956/03/20, 57 y.o.   MRN: [id number]  HPI  .Here for wellness and f/u;  Overall doing ok;  Pt denies CP, worsening SOB, DOE, wheezing, orthopnea, PND, worsening LE edema, palpitations, dizziness or syncope.  Pt denies neurological change such as new headache, facial or extremity weakness.  Pt denies polydipsia, polyuria, or low sugar symptoms. Pt states overall good compliance with treatment and medications, good tolerability, and has been trying to follow lower cholesterol diet.  Pt denies worsening depressive symptoms, suicidal ideation or panic. No fever, night sweats, wt loss, loss of appetite, or other constitutional symptoms.  Pt states good ability with ADL's, has low fall risk, home safety reviewed and adequate, no other significant changes in hearing or vision, and only occasionally active with exercise. No new complaints.  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 Did pass renal stone last yr after last seen, and did not feel need to see urology, Trying to be more active, joined a gym, bu slowed a bit due to knee and hip pain, now doing the ellipitical with 5 miles at HR 135.   Past Medical History  Diagnosis Date  . Rhinitis, allergic     skin test pos 11/26/08- dust,cat  . Rhinosinusitis     recurrent  . Asthma     chilhood   Past Surgical History  Procedure Laterality Date  . Left knee    . Right wrist    . Rotator cuff repair      left    reports that he quit smoking about 2 years ago. His smoking use included Cigarettes. He has a 52.5 pack-year smoking history. He does not have any smokeless tobacco history on file. He reports that he uses illicit drugs (Marijuana). He reports that he does not drink alcohol. family history includes Colon cancer in an other family member; Heart disease in his father;  Hypertension in his mother; Lung cancer in an other family member; Other in an other family member; Thyroid disease in his mother. No Known Allergies Current Outpatient Prescriptions on File Prior to Visit  Medication Sig Dispense Refill  . fluticasone (FLONASE) 50 MCG/ACT nasal spray Place 2 sprays into the nose daily.  16 g  1  . valACYclovir (VALTREX) 1000 MG tablet Take 1 tablet (1,000 mg total) by mouth 3 (three) times daily. As needed for cold sore episodes  21 tablet  3   No current facility-administered medications on file prior to visit.    Review of Systems Constitutional: Negative for diaphoresis, activity change, appetite change or unexpected weight change.  HENT: Negative for hearing loss, ear pain, facial swelling, mouth sores and neck stiffness.   Eyes: Negative for pain, redness and visual disturbance.  Respiratory: Negative for shortness of breath and wheezing.   Cardiovascular: Negative for chest pain and palpitations.  Gastrointestinal: Negative for diarrhea, blood in stool, abdominal distention or other pain Genitourinary: Negative for hematuria, flank pain or change in urine volume.  Musculoskeletal: Negative for myalgias and joint swelling.  Skin: Negative for color change and wound.  Neurological: Negative for syncope and numbness. other than noted Hematological: Negative for adenopathy.  Psychiatric/Behavioral: Negative for hallucinations, self-injury, decreased concentration and agitation.      Objective:   Physical Exam BP 118/82  Pulse 60  Temp(Src) 97.4  F (36.3 C) (Oral)  Ht 5\' 7"  (1.702 m)  Wt 174 lb (78.926 kg)  BMI 27.25 kg/m2  SpO2 99% VS noted,  Constitutional: Pt is oriented to person, place, and time. Appears well-developed and well-nourished.  Head: Normocephalic and atraumatic.  Right Ear: External ear normal.  Left Ear: External ear normal.  Nose: Nose normal.  Mouth/Throat: Oropharynx is clear and moist.  Eyes: Conjunctivae and EOM are  normal. Pupils are equal, round, and reactive to light.  Neck: Normal range of motion. Neck supple. No JVD present. No tracheal deviation present.  Cardiovascular: Normal rate, regular rhythm, normal heart sounds and intact distal pulses.   Pulmonary/Chest: Effort normal and breath sounds normal.  Abdominal: Soft. Bowel sounds are normal. There is no tenderness. No HSM  Musculoskeletal: Normal range of motion. Exhibits no edema.  Lymphadenopathy:  Has no cervical adenopathy.  Neurological: Pt is alert and oriented to person, place, and time. Pt has normal reflexes. No cranial nerve deficit.  Skin: Skin is warm and dry. No rash noted.  Psychiatric:  Has  normal mood and affect. Behavior is normal. mild nervous    Assessment & Plan:

## 2013-12-11 NOTE — Progress Notes (Signed)
Pre-visit discussion using our clinic review tool. No additional management support is needed unless otherwise documented below in the visit note.  

## 2013-12-11 NOTE — Assessment & Plan Note (Signed)
Resolved 2013 with passing stone

## 2013-12-11 NOTE — Assessment & Plan Note (Signed)

## 2013-12-11 NOTE — Patient Instructions (Addendum)
Please continue all other medications as before, and refills have been done if requested Please have the pharmacy call with any other refills you may need. Please continue your efforts at being more active, low cholesterol diet, and weight control. You are otherwise up to date with prevention measures today. Please keep your appointments with your specialists as you may have planned  Please go to the LAB in the Basement (turn left off the elevator) for the tests to be done today You will be contacted by phone if any changes need to be made immediately.  Otherwise, you will receive a letter about your results with an explanation, but please check with MyChart first.  Please remember to sign up for My Chart if you have not done so, as this will be important to you in the future with finding out test results, communicating by private email, and scheduling acute appointments online when needed. Your username is [email or web address] Please contact [employer] for MyChart if you afre unable to gain access  Please return in 1 year for your yearly visit, or sooner if needed, with Lab testing done 3-5 days before

## 2013-12-12 ENCOUNTER — Other Ambulatory Visit: Payer: Self-pay | Admitting: Internal Medicine

## 2013-12-12 MED ORDER — ATORVASTATIN CALCIUM 10 MG PO TABS: 10.0000 mg | ORAL_TABLET | Freq: Every day | ORAL | Status: DC

## 2013-12-24 ENCOUNTER — Ambulatory Visit (INDEPENDENT_AMBULATORY_CARE_PROVIDER_SITE_OTHER): Payer: Federal, State, Local not specified - PPO | Admitting: Cardiology

## 2013-12-24 ENCOUNTER — Encounter: Payer: Self-pay | Admitting: Cardiology

## 2013-12-24 VITALS — BP 136/87 | HR 69 | Ht 67.0 in | Wt 173.4 lb

## 2013-12-24 DIAGNOSIS — R002 Palpitations: Secondary | ICD-10-CM

## 2013-12-24 NOTE — Progress Notes (Signed)
  HPI The patient presents for evaluation of palpitations. Since I last saw him he has done well. He is exercising routinely. He says he feels well when he does this. He does have palpitations when he is under stress. 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 He says if he stretches out on the couch his palpitations we'll go away. He rarely takes Xanax. He takes his beta blocker. He otherwise denies any cardiovascular symptoms. He has no chest pressure, neck or arm discomfort. He's had no weight gain or edema.  Allergies  Allergen Reactions  . Codeine     Causes peeling of the hands    Current Outpatient Prescriptions  Medication Sig Dispense Refill  . ALPRAZolam (XANAX) 0.25 MG tablet 1 every 6 hrs as needed for heart racing  30 tablet  2  . ALREX 0.2 % SUSP As directed      . atorvastatin (LIPITOR) 10 MG tablet Take 1 tablet (10 mg total) by mouth daily.  90 tablet  3  . cyclobenzaprine (FLEXERIL) 5 MG tablet Take 1 tablet (5 mg total) by mouth 3 (three) times daily as needed for muscle spasms.  60 tablet  11  . fluticasone (FLONASE) 50 MCG/ACT nasal spray Place 2 sprays into the nose daily.  16 g  1  . metoprolol tartrate (LOPRESSOR) 25 MG tablet Take 1 tablet (25 mg total) by mouth 2 (two) times daily.  180 tablet  0  . valACYclovir (VALTREX) 1000 MG tablet Take 1 tablet (1,000 mg total) by mouth 3 (three) times daily. As needed for cold sore episodes  21 tablet  3   No current facility-administered medications for this visit.    Past Medical History  Diagnosis Date  . Rhinitis, allergic     skin test pos 11/26/08- dust,cat  . Rhinosinusitis     recurrent  . Asthma     chilhood    Past Surgical History  Procedure Laterality Date  . Left knee    . Right wrist    . Rotator cuff repair      left    ROS:  As stated in the HPI and negative for all other systems.  PHYSICAL EXAM BP 136/87  Pulse 69  Ht 5\' 7"  (1.702 m)  Wt 173 lb 6.4  oz (78.654 kg)  BMI 27.15 kg/m2 GENERAL:  Well appearing NECK:  No jugular venous distention, waveform within normal limits, carotid upstroke brisk and symmetric, no bruits, no thyromegaly LUNGS:  Clear to auscultation bilaterally HEART:  PMI not displaced or sustained,S1 and S2 within normal limits, no S3, no S4, no clicks, no rubs, no murmurs ABD:  Flat, positive bowel sounds normal in frequency in pitch, no bruits, no rebound, no guarding, no midline pulsatile mass, no hepatomegaly, no splenomegaly EXT:  2 plus pulses throughout, no edema, no cyanosis no clubbing  EKG:  Sinus rhythm, rate 87, axis within normal limits, intervals within normal limits, no acute ST-T wave changes.  ASSESSMENT AND PLAN  Palpitations -  He has palpitations only related to stress. These are typically controlled. No change in therapy is indicated.  TOBACCO USE DISORDER -  He remains off of cigarettes.   Dyslipidemia - I have reviewed his lipids. Despite good diet his LDL is 167. I agree with Dr. Jonny Ruiz that Lipitor is indicated and I discussed this with the patient.

## 2013-12-24 NOTE — Patient Instructions (Signed)
The current medical regimen is effective;  continue present plan and medications.  Follow up in 1 year with Dr Hochrein.  You will receive a letter in the mail 2 months before you are due.  Please call us when you receive this letter to schedule your follow up appointment.  

## 2014-01-16 ENCOUNTER — Ambulatory Visit (AMBULATORY_SURGERY_CENTER): Payer: Self-pay

## 2014-01-16 VITALS — Ht 67.0 in | Wt 170.0 lb

## 2014-01-16 DIAGNOSIS — Z860100 Personal history of colon polyps, unspecified: Secondary | ICD-10-CM

## 2014-01-16 MED ORDER — MOVIPREP 100 G PO SOLR: 1.0000 | Freq: Once | ORAL | Status: DC

## 2014-01-16 NOTE — Progress Notes (Signed)
Pt went to surgical referral concerning hemorrhoids.  MD told pt not to have surgery until hems bothering him profoundly.  Pt declined surgery at this time.

## 2014-01-18 ENCOUNTER — Other Ambulatory Visit: Payer: Self-pay | Admitting: Internal Medicine

## 2014-01-23 ENCOUNTER — Encounter: Payer: Self-pay | Admitting: Gastroenterology

## 2014-01-27 ENCOUNTER — Ambulatory Visit (AMBULATORY_SURGERY_CENTER): Payer: Federal, State, Local not specified - PPO | Admitting: Gastroenterology

## 2014-01-27 ENCOUNTER — Encounter: Payer: Self-pay | Admitting: Gastroenterology

## 2014-01-27 VITALS — BP 119/71 | HR 54 | Temp 95.4°F | Resp 18 | Ht 67.0 in | Wt 170.0 lb

## 2014-01-27 DIAGNOSIS — K644 Residual hemorrhoidal skin tags: Secondary | ICD-10-CM

## 2014-01-27 DIAGNOSIS — D126 Benign neoplasm of colon, unspecified: Secondary | ICD-10-CM

## 2014-01-27 DIAGNOSIS — Z86010 Personal history of colonic polyps: Secondary | ICD-10-CM

## 2014-01-27 MED ORDER — SODIUM CHLORIDE 0.9 % IV SOLN: 500.0000 mL | INTRAVENOUS | Status: DC

## 2014-01-27 NOTE — Patient Instructions (Signed)
YOU HAD AN ENDOSCOPIC PROCEDURE TODAY AT [facility]: Refer to the procedure report that was given to you for any specific questions about what was found during the examination.  If the procedure report does not answer your questions, please call your gastroenterologist to clarify.  If you requested that your care partner not be given the details of your procedure findings, then the procedure report has been included in a sealed envelope for you to review at your convenience later.  YOU SHOULD EXPECT: Some feelings of bloating in the abdomen. Passage of more gas than usual.  Walking can help get rid of the air that was put into your GI tract during the procedure and reduce the bloating. If you had a lower endoscopy (such as a colonoscopy or flexible sigmoidoscopy) you may notice spotting of blood in your stool or on the toilet paper. If you underwent a bowel prep for your procedure, then you may not have a normal bowel movement for a few days.  DIET: Your first meal following the procedure should be a light meal and then it is ok to progress to your normal diet.  A half-sandwich or bowl of soup is an example of a good first meal.  Heavy or fried foods are harder to digest and may make you feel nauseous or bloated.  Likewise meals heavy in dairy and vegetables can cause extra gas to form and this can also increase the bloating.  Drink plenty of fluids but you should avoid alcoholic beverages for 24 hours.  ACTIVITY: Your care partner should take you home directly after the procedure.  You should plan to take it easy, moving slowly for the rest of the day.  You can resume normal activity the day after the procedure however you should NOT DRIVE or use heavy machinery for 24 hours (because of the sedation medicines used during the test).    SYMPTOMS TO REPORT IMMEDIATELY: A gastroenterologist can be reached at any hour.  During normal business hours, 8:30 AM to 5:00 PM Monday through Friday,  call [phone].  After hours and on weekends, please call the GI answering service at [phone]  Emergency number  who will take a message and have the physician on call contact you.   Following lower endoscopy (colonoscopy or flexible sigmoidoscopy):  Excessive amounts of blood in the stool  Significant tenderness or worsening of abdominal pains  Swelling of the abdomen that is new, acute  Fever of 100F or higher  FOLLOW UP: If any biopsies were taken you will be contacted by phone or by letter within the next 1-3 weeks.  Call your gastroenterologist if you have not heard about the biopsies in 3 weeks.  Our staff will call the home number listed on your records the next business day following your procedure to check on you and address any questions or concerns that you may have at that time regarding the information given to you following your procedure. This is a courtesy call and so if there is no answer at the home number and we have not heard from you through the emergency physician on call, we will assume that you have returned to your regular daily activities without incident.  SIGNATURES/CONFIDENTIALITY: You and/or your care partner have signed paperwork which will be entered into your electronic medical record.  These signatures attest to the fact that that the information above on your After Visit Summary has been reviewed and is understood.  Full responsibility of  the confidentiality of this discharge information lies with you and/or your care-partner.  Handout on polyps and hemorrhoids Repeat colon in 5 years

## 2014-01-27 NOTE — Op Note (Signed)
[place]  [address]. [place], [address]   COLONOSCOPY PROCEDURE REPORT  PATIENT: Martin Walker, Martin Walker  MR#: [id number] BIRTHDATE: Sep 30, 1956 , 44  yrs. old GENDER: Male ENDOSCOPIST: Milus Banister, MD PROCEDURE DATE:  01/27/2014 PROCEDURE:   Colonoscopy with snare polypectomy First Screening Colonoscopy - Avg.  risk and is 50 yrs.  old or older - No.  Prior Negative Screening - Now for repeat screening. N/A  History of Adenoma - Now for follow-up colonoscopy & has been > or = to 3 yrs.  Yes hx of adenoma.  Has been 3 or more years since last colonoscopy.  Polyps Removed Today? Yes. ASA CLASS:   Class II INDICATIONS:1.2cm adenomatous polyp, removed 3 years ago. MEDICATIONS: Fentanyl 50 mcg IV, Versed 6 mg IV, and These medications were titrated to patient response per physician's verbal order  DESCRIPTION OF PROCEDURE:   After the risks benefits and alternatives of the procedure were thoroughly explained, informed consent was obtained.  A digital rectal exam revealed no abnormalities of the rectum.   The LB CF-[id number] Loaner [id number] endoscope was introduced through the anus and advanced to the cecum, which was identified by both the appendix and ileocecal valve. No adverse events experienced.   The quality of the prep was excellent.  The instrument was then slowly withdrawn as the colon was fully examined.  COLON FINDINGS: Two polyps were found, removed and sent to pathology.  These were both sessile, 3-9mm across, located in transverse and sigmoid segments, removed with cold snare.  There were medium sized external hemorrhoids.  The examination was otherwise normal.  Retroflexed views revealed no abnormalities. The time to cecum=1 minutes 24 seconds.  Withdrawal time=7 minutes 55 seconds.  The scope was withdrawn and the procedure completed. COMPLICATIONS: There were no complications.  ENDOSCOPIC IMPRESSION: Two polyps were found, removed and sent to  pathology. There were medium sized external hemorrhoids.  The examination was otherwise normal.  RECOMMENDATIONS: Given your personal history of significant adenomatous (pre-cancerous) polyp (see above), you will need a repeat colonoscopy in 5 years even if the polyps removed today are not pre-cancerous.   eSigned:  Milus Banister, MD 01/27/2014 9:28 AM   cc: Cathlean Cower, MD

## 2014-01-27 NOTE — Progress Notes (Signed)
Patient did not experience any of the following events: a burn prior to discharge; a fall within the facility; wrong site/side/patient/procedure/implant event; or a hospital transfer or hospital admission upon discharge from the facility. (G8907) Patient did not have preoperative order for IV antibiotic SSI prophylaxis. (G8918)  

## 2014-01-28 ENCOUNTER — Telehealth: Payer: Self-pay

## 2014-01-28 NOTE — Telephone Encounter (Signed)
   Follow up Call-  Call back number 01/27/2014  Post procedure Call Back phone  # [phone]  Permission to leave phone message Yes     Patient questions:  Do you have a fever, pain , or abdominal swelling? no Pain Score  0 *  Have you tolerated food without any problems? yes  Have you been able to return to your normal activities? yes  Do you have any questions about your discharge instructions: Diet   no Medications  no Follow up visit  no  Do you have questions or concerns about your Care? no  Actions: * If pain score is 4 or above: No action needed, pain <4.

## 2014-02-14 ENCOUNTER — Telehealth: Payer: Self-pay | Admitting: Gastroenterology

## 2014-02-14 NOTE — Telephone Encounter (Signed)
We discussed the fact that his polyps were lost after removal and retrieval. I explained to him that we were looking into house possibly happen and we will be implementing some sort of a change to her system to ensure that will never happen again. I showed his case to my partners and we all agree that five-year colonoscopy recall is the correct medical recommendation to make at this point. He had a very good open discussion about this and he seemed happy with the discussion. I offered that he meet with me in the office if he has any future questions or concerns. I also explained I'm happy to send his records to any other gastroenterologist he wants review.  He understands if he has any significant changes in his bowels prior to 5 years from now he should call.   Patty,  Please put him in for recall colonoscopy at 5 year interval.

## 2014-02-14 NOTE — Telephone Encounter (Signed)
Recall in EPIC

## 2014-05-07 ENCOUNTER — Other Ambulatory Visit: Payer: Self-pay | Admitting: Internal Medicine

## 2014-11-04 ENCOUNTER — Ambulatory Visit (INDEPENDENT_AMBULATORY_CARE_PROVIDER_SITE_OTHER): Payer: Federal, State, Local not specified - PPO

## 2014-11-04 ENCOUNTER — Ambulatory Visit: Payer: Federal, State, Local not specified - PPO

## 2014-11-04 ENCOUNTER — Other Ambulatory Visit: Payer: Self-pay | Admitting: Internal Medicine

## 2014-11-04 DIAGNOSIS — Z23 Encounter for immunization: Secondary | ICD-10-CM

## 2014-12-10 ENCOUNTER — Other Ambulatory Visit (INDEPENDENT_AMBULATORY_CARE_PROVIDER_SITE_OTHER): Payer: Federal, State, Local not specified - PPO

## 2014-12-10 DIAGNOSIS — Z0000 Encounter for general adult medical examination without abnormal findings: Secondary | ICD-10-CM

## 2014-12-10 LAB — CBC WITH DIFFERENTIAL/PLATELET
Basophils Absolute: 0.1 10*3/uL (ref 0.0–0.1)
Basophils Relative: 0.4 % (ref 0.0–3.0)
EOS PCT: 2.1 % (ref 0.0–5.0)
Eosinophils Absolute: 0.3 10*3/uL (ref 0.0–0.7)
HCT: 46.3 % (ref 39.0–52.0)
Hemoglobin: 15.4 g/dL (ref 13.0–17.0)
Lymphocytes Relative: 20.6 % (ref 12.0–46.0)
Lymphs Abs: 2.8 10*3/uL (ref 0.7–4.0)
MCHC: 33.2 g/dL (ref 30.0–36.0)
MCV: 93.1 fl (ref 78.0–100.0)
MONO ABS: 0.9 10*3/uL (ref 0.1–1.0)
Monocytes Relative: 6.8 % (ref 3.0–12.0)
Neutro Abs: 9.6 10*3/uL — ABNORMAL HIGH (ref 1.4–7.7)
Neutrophils Relative %: 70.1 % (ref 43.0–77.0)
Platelets: 317 10*3/uL (ref 150.0–400.0)
RBC: 4.97 Mil/uL (ref 4.22–5.81)
RDW: 13.1 % (ref 11.5–15.5)
WBC: 13.7 10*3/uL — AB (ref 4.0–10.5)

## 2014-12-10 LAB — URINALYSIS, ROUTINE W REFLEX MICROSCOPIC
BILIRUBIN URINE: NEGATIVE
Ketones, ur: NEGATIVE
LEUKOCYTES UA: NEGATIVE
NITRITE: NEGATIVE
Specific Gravity, Urine: 1.025 (ref 1.000–1.030)
Urine Glucose: NEGATIVE
Urobilinogen, UA: 0.2 (ref 0.0–1.0)
pH: 5.5 (ref 5.0–8.0)

## 2014-12-10 LAB — HEPATIC FUNCTION PANEL
ALK PHOS: 94 U/L (ref 39–117)
ALT: 27 U/L (ref 0–53)
AST: 30 U/L (ref 0–37)
Albumin: 4.4 g/dL (ref 3.5–5.2)
BILIRUBIN TOTAL: 1 mg/dL (ref 0.2–1.2)
Bilirubin, Direct: 0.1 mg/dL (ref 0.0–0.3)
Total Protein: 8.1 g/dL (ref 6.0–8.3)

## 2014-12-10 LAB — PSA: PSA: 1.22 ng/mL (ref 0.10–4.00)

## 2014-12-10 LAB — BASIC METABOLIC PANEL
BUN: 16 mg/dL (ref 6–23)
CO2: 25 mEq/L (ref 19–32)
Calcium: 10.2 mg/dL (ref 8.4–10.5)
Chloride: 104 mEq/L (ref 96–112)
Creatinine, Ser: 1.2 mg/dL (ref 0.4–1.5)
GFR: 63.7 mL/min (ref >60.00)
GLUCOSE: 90 mg/dL (ref 70–99)
Potassium: 5.4 mEq/L — ABNORMAL HIGH (ref 3.5–5.1)
Sodium: 142 mEq/L (ref 135–145)

## 2014-12-10 LAB — LIPID PANEL
Cholesterol: 251 mg/dL — ABNORMAL HIGH (ref 0–200)
HDL: 36.9 mg/dL — AB (ref >39.00)
LDL Cholesterol: 193 mg/dL — ABNORMAL HIGH (ref 0–99)
NONHDL: 214.1
Total CHOL/HDL Ratio: 7
Triglycerides: 105 mg/dL (ref 0.0–149.0)
VLDL: 21 mg/dL (ref 0.0–40.0)

## 2014-12-10 LAB — TSH: TSH: 1.08 u[IU]/mL (ref 0.35–4.50)

## 2014-12-16 ENCOUNTER — Encounter: Payer: Self-pay | Admitting: Internal Medicine

## 2014-12-16 ENCOUNTER — Ambulatory Visit (INDEPENDENT_AMBULATORY_CARE_PROVIDER_SITE_OTHER): Payer: Federal, State, Local not specified - PPO | Admitting: Internal Medicine

## 2014-12-16 VITALS — BP 120/80 | HR 66 | Temp 98.0°F | Ht 67.0 in | Wt 171.4 lb

## 2014-12-16 DIAGNOSIS — E785 Hyperlipidemia, unspecified: Secondary | ICD-10-CM

## 2014-12-16 DIAGNOSIS — Z0000 Encounter for general adult medical examination without abnormal findings: Secondary | ICD-10-CM

## 2014-12-16 DIAGNOSIS — R109 Unspecified abdominal pain: Secondary | ICD-10-CM

## 2014-12-16 MED ORDER — ALPRAZOLAM 0.25 MG PO TABS: ORAL_TABLET | ORAL | Status: DC

## 2014-12-16 MED ORDER — FLUTICASONE PROPIONATE 50 MCG/ACT NA SUSP: 2.0000 | Freq: Every day | NASAL | Status: DC

## 2014-12-16 MED ORDER — METOPROLOL TARTRATE 25 MG PO TABS: 25.0000 mg | ORAL_TABLET | Freq: Two times a day (BID) | ORAL | Status: DC

## 2014-12-16 MED ORDER — ATORVASTATIN CALCIUM 20 MG PO TABS: 20.0000 mg | ORAL_TABLET | Freq: Every day | ORAL | Status: DC

## 2014-12-16 MED ORDER — CYCLOBENZAPRINE HCL 5 MG PO TABS: 5.0000 mg | ORAL_TABLET | Freq: Three times a day (TID) | ORAL | Status: DC | PRN

## 2014-12-16 NOTE — Assessment & Plan Note (Signed)

## 2014-12-16 NOTE — Progress Notes (Signed)
Pre visit review using our clinic review tool, if applicable. No additional management support is needed unless otherwise documented below in the visit note. 

## 2014-12-16 NOTE — Patient Instructions (Signed)
Please continue all other medications as before, and refills have been done if requested.  Please have the pharmacy call with any other refills you may need.  Please continue your efforts at being more active, low cholesterol diet, and weight control.  You are otherwise up to date with prevention measures today.  Please keep your appointments with your specialists as you may have planned  Please return in 1 year for your yearly visit, or sooner if needed, with Lab testing done 3-5 days before  

## 2014-12-16 NOTE — Progress Notes (Signed)
Subjective:    Patient ID: Martin Walker, male    DOB: 08-13-1956, 58 y.o.   MRN: [id number]  HPI  Here for wellness and f/u;  Overall doing ok;  Pt denies CP, worsening SOB, DOE, wheezing, orthopnea, PND, worsening LE edema, palpitations, dizziness or syncope.  Pt denies neurological change such as new headache, facial or extremity weakness.  Pt denies polydipsia, polyuria, or low sugar symptoms. Pt states overall good compliance with treatment and medications, good tolerability, and has been trying to follow lower cholesterol diet.  Pt denies worsening depressive symptoms, suicidal ideation or panic. No fever, night sweats, wt loss, loss of appetite, or other constitutional symptoms.  Pt states good ability with ADL's, has low fall risk, home safety reviewed and adequate, no other significant changes in hearing or vision, and very active with exercise with 1-2 hrs at gym almost daily.. Quit taking the lipitor since he has been going to the gym with lots of cardio.. No acute complaints  Quit smokin x 5 yrs, prior 3/4 ppd for 40 yrs  Needs xanax refill,Denies worsening depressive symptoms, suicidal ideation, or panic Past Medical History  Diagnosis Date  . Rhinitis, allergic     skin test pos 11/26/08- dust,cat  . Rhinosinusitis     recurrent  . Asthma     chilhood   Past Surgical History  Procedure Laterality Date  . Left knee    . Right wrist    . Rotator cuff repair      left  . Colonoscopy      reports that he quit smoking about 3 years ago. His smoking use included Cigarettes. He has a 52.5 pack-year smoking history. He has quit using smokeless tobacco. He reports that he drinks alcohol. He reports that he uses illicit drugs (Marijuana). family history includes Colon cancer in an other family member; Heart disease in his father; Hypertension in his mother; Lung cancer in an other family member; Other in an other family member; Thyroid disease in his mother. Allergies  Allergen  Reactions  . Codeine     Causes peeling of the hands   Current Outpatient Prescriptions on File Prior to Visit  Medication Sig Dispense Refill  . ALPRAZolam (XANAX) 0.25 MG tablet 1 every 6 hrs as needed for heart racing 30 tablet 2  . valACYclovir (VALTREX) 1000 MG tablet Take 1 tablet (1,000 mg total) by mouth 3 (three) times daily. As needed for cold sore episodes 21 tablet 3  . ALREX 0.2 % SUSP As directed     No current facility-administered medications on file prior to visit.    Review of Systems Constitutional: Negative for increased diaphoresis, other activity, appetite or other siginficant weight change  HENT: Negative for worsening hearing loss, ear pain, facial swelling, mouth sores and neck stiffness.   Eyes: Negative for other worsening pain, redness or visual disturbance.  Respiratory: Negative for shortness of breath and wheezing.   Cardiovascular: Negative for chest pain and palpitations.  Gastrointestinal: Negative for diarrhea, blood in stool, abdominal distention or other pain Genitourinary: Negative for hematuria, flank pain or change in urine volume.  Musculoskeletal: Negative for myalgias or other joint complaints.  Skin: Negative for color change and wound.  Neurological: Negative for syncope and numbness. other than noted Hematological: Negative for adenopathy. or other swelling Psychiatric/Behavioral: Negative for hallucinations, self-injury, decreased concentration or other worsening agitation.      Objective:   Physical Exam BP 120/80 mmHg  Pulse 66  Temp(Src) 98  F (36.7 C) (Oral)  Ht 5\' 7"  (1.702 m)  Wt 171 lb 6 oz (77.735 kg)  BMI 26.83 kg/m2  SpO2 97% VS noted,  Constitutional: Pt is oriented to person, place, and time. Appears well-developed and well-nourished.  Head: Normocephalic and atraumatic.  Right Ear: External ear normal.  Left Ear: External ear normal.  Nose: Nose normal.  Mouth/Throat: Oropharynx is clear and moist.  Eyes:  Conjunctivae and EOM are normal. Pupils are equal, round, and reactive to light.  Neck: Normal range of motion. Neck supple. No JVD present. No tracheal deviation present.  Cardiovascular: Normal rate, regular rhythm, normal heart sounds and intact distal pulses.   Pulmonary/Chest: Effort normal and breath sounds without rales or wheezing  Abdominal: Soft. Bowel sounds are normal. NT. No HSM  Musculoskeletal: Normal range of motion. Exhibits no edema.  Lymphadenopathy:  Has no cervical adenopathy.  Neurological: Pt is alert and oriented to person, place, and time. Pt has normal reflexes. No cranial nerve deficit. Motor grossly intact Skin: Skin is warm and dry. No rash noted.  Psychiatric:  Has normal mood and affect. Behavior is normal.     Assessment & Plan:

## 2014-12-16 NOTE — Assessment & Plan Note (Addendum)
Severe ldl increased off statin, to re-start lipitor at 20 mg qd, d/w pt, for lower chol diet  Lab Results  Component Value Date   LDLCALC 193* 12/10/2014

## 2014-12-17 ENCOUNTER — Ambulatory Visit (INDEPENDENT_AMBULATORY_CARE_PROVIDER_SITE_OTHER): Payer: Federal, State, Local not specified - PPO | Admitting: Cardiology

## 2014-12-17 ENCOUNTER — Encounter: Payer: Self-pay | Admitting: Cardiology

## 2014-12-17 VITALS — BP 118/70 | Ht 67.0 in | Wt 171.0 lb

## 2014-12-17 DIAGNOSIS — R002 Palpitations: Secondary | ICD-10-CM

## 2014-12-17 NOTE — Progress Notes (Signed)
  HPI The patient presents for evaluation of palpitations. Since I last saw him he has done well. He is exercising routinely. He says he feels well when he does this. 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 He eats very well and is disappointed that his LDL is elevated as below.    Allergies  Allergen Reactions  . Codeine     Causes peeling of the hands    Current Outpatient Prescriptions  Medication Sig Dispense Refill  . ALPRAZolam (XANAX) 0.25 MG tablet 1 every 6 hrs as needed for heart racing 30 tablet 2  . ALREX 0.2 % SUSP As directed    . atorvastatin (LIPITOR) 20 MG tablet Take 1 tablet (20 mg total) by mouth daily. 90 tablet 3  . cyclobenzaprine (FLEXERIL) 5 MG tablet Take 1 tablet (5 mg total) by mouth 3 (three) times daily as needed for muscle spasms. 60 tablet 11  . fluticasone (FLONASE) 50 MCG/ACT nasal spray Place 2 sprays into both nostrils daily. 16 g 11  . metoprolol tartrate (LOPRESSOR) 25 MG tablet Take 1 tablet (25 mg total) by mouth 2 (two) times daily. 180 tablet 3  . valACYclovir (VALTREX) 1000 MG tablet Take 1 tablet (1,000 mg total) by mouth 3 (three) times daily. As needed for cold sore episodes 21 tablet 3   No current facility-administered medications for this visit.    Past Medical History  Diagnosis Date  . Rhinitis, allergic     skin test pos 11/26/08- dust,cat  . Rhinosinusitis     recurrent  . Asthma     chilhood    Past Surgical History  Procedure Laterality Date  . Left knee    . Right wrist    . Rotator cuff repair      left  . Colonoscopy      ROS:  As stated in the HPI and negative for all other systems.  PHYSICAL EXAM BP 118/70 mmHg  Ht 5\' 7"  (1.702 m)  Wt 171 lb (77.565 kg)  BMI 26.78 kg/m2 GENERAL:  Well appearing NECK:  No jugular venous distention, waveform within normal limits, carotid upstroke brisk and symmetric, no bruits, no thyromegaly LUNGS:   Clear to auscultation bilaterally HEART:  PMI not displaced or sustained,S1 and S2 within normal limits, no S3, no S4, no clicks, no rubs, no murmurs ABD:  Flat, positive bowel sounds normal in frequency in pitch, no bruits, no rebound, no guarding, no midline pulsatile mass, no hepatomegaly, no splenomegaly EXT:  2 plus pulses throughout, no edema, no cyanosis no clubbing  EKG:  Sinus rhythm, rate 58, axis within normal limits, intervals within normal limits, no acute ST-T wave changes.  12/16/14  ASSESSMENT AND PLAN  Palpitations -  He has palpitations only related to stress. These are typically controlled. No change in therapy is indicated.  TOBACCO USE DISORDER -  He remains off of cigarettes.   Dyslipidemia - I have reviewed his lipids. Despite good diet his LDL is 193 calculated. His Lipitor was just increased.  I suspect that he will need max dose Crestor but I will defer to Dr. Jenny Reichmann.

## 2014-12-17 NOTE — Patient Instructions (Signed)
Your physician recommends that you schedule a follow-up appointment in: one year with Dr. Hochrein  

## 2015-10-28 ENCOUNTER — Ambulatory Visit (INDEPENDENT_AMBULATORY_CARE_PROVIDER_SITE_OTHER): Payer: Federal, State, Local not specified - PPO

## 2015-10-28 DIAGNOSIS — Z23 Encounter for immunization: Secondary | ICD-10-CM

## 2015-12-09 ENCOUNTER — Other Ambulatory Visit: Payer: Self-pay | Admitting: Internal Medicine

## 2015-12-14 ENCOUNTER — Other Ambulatory Visit (INDEPENDENT_AMBULATORY_CARE_PROVIDER_SITE_OTHER): Payer: Federal, State, Local not specified - PPO

## 2015-12-14 DIAGNOSIS — Z0000 Encounter for general adult medical examination without abnormal findings: Secondary | ICD-10-CM | POA: Diagnosis not present

## 2015-12-14 LAB — BASIC METABOLIC PANEL
BUN: 16 mg/dL (ref 6–23)
CALCIUM: 9.5 mg/dL (ref 8.4–10.5)
CO2: 28 meq/L (ref 19–32)
Chloride: 104 mEq/L (ref 96–112)
Creatinine, Ser: 0.98 mg/dL (ref 0.40–1.50)
GFR: 83.28 mL/min (ref >60.00)
Glucose, Bld: 106 mg/dL — ABNORMAL HIGH (ref 70–99)
POTASSIUM: 5 meq/L (ref 3.5–5.1)
SODIUM: 140 meq/L (ref 135–145)

## 2015-12-14 LAB — LIPID PANEL
Cholesterol: 152 mg/dL (ref 0–200)
HDL: 38.2 mg/dL — ABNORMAL LOW
LDL Cholesterol: 86 mg/dL (ref 0–99)
NonHDL: 113.66
Total CHOL/HDL Ratio: 4
Triglycerides: 138 mg/dL (ref 0.0–149.0)
VLDL: 27.6 mg/dL (ref 0.0–40.0)

## 2015-12-14 LAB — URINALYSIS, ROUTINE W REFLEX MICROSCOPIC
BILIRUBIN URINE: NEGATIVE
Ketones, ur: NEGATIVE
LEUKOCYTES UA: NEGATIVE
NITRITE: NEGATIVE
RBC / HPF: NONE SEEN (ref 0–?)
Specific Gravity, Urine: 1.02 (ref 1.000–1.030)
Total Protein, Urine: NEGATIVE
Urine Glucose: NEGATIVE
Urobilinogen, UA: 0.2 (ref 0.0–1.0)
pH: 6 (ref 5.0–8.0)

## 2015-12-14 LAB — CBC WITH DIFFERENTIAL/PLATELET
Basophils Absolute: 0.1 10*3/uL (ref 0.0–0.1)
Basophils Relative: 0.6 % (ref 0.0–3.0)
Eosinophils Absolute: 0.3 10*3/uL (ref 0.0–0.7)
Eosinophils Relative: 3.7 % (ref 0.0–5.0)
HCT: 44 % (ref 39.0–52.0)
Hemoglobin: 14.7 g/dL (ref 13.0–17.0)
Lymphocytes Relative: 30.4 % (ref 12.0–46.0)
Lymphs Abs: 2.6 10*3/uL (ref 0.7–4.0)
MCHC: 33.3 g/dL (ref 30.0–36.0)
MCV: 94.3 fl (ref 78.0–100.0)
Monocytes Absolute: 0.6 10*3/uL (ref 0.1–1.0)
Monocytes Relative: 6.8 % (ref 3.0–12.0)
Neutro Abs: 4.9 10*3/uL (ref 1.4–7.7)
Neutrophils Relative %: 58.5 % (ref 43.0–77.0)
Platelets: 306 10*3/uL (ref 150.0–400.0)
RBC: 4.67 Mil/uL (ref 4.22–5.81)
RDW: 13.1 % (ref 11.5–15.5)
WBC: 8.4 10*3/uL (ref 4.0–10.5)

## 2015-12-14 LAB — HEPATIC FUNCTION PANEL
ALBUMIN: 4.3 g/dL (ref 3.5–5.2)
ALK PHOS: 87 U/L (ref 39–117)
ALT: 40 U/L (ref 0–53)
AST: 26 U/L (ref 0–37)
BILIRUBIN DIRECT: 0.1 mg/dL (ref 0.0–0.3)
TOTAL PROTEIN: 7.2 g/dL (ref 6.0–8.3)
Total Bilirubin: 0.6 mg/dL (ref 0.2–1.2)

## 2015-12-14 LAB — PSA: PSA: 1.14 ng/mL (ref 0.10–4.00)

## 2015-12-14 LAB — TSH: TSH: 1.5 u[IU]/mL (ref 0.35–4.50)

## 2015-12-18 ENCOUNTER — Ambulatory Visit (INDEPENDENT_AMBULATORY_CARE_PROVIDER_SITE_OTHER): Payer: Federal, State, Local not specified - PPO | Admitting: Internal Medicine

## 2015-12-18 ENCOUNTER — Encounter: Payer: Self-pay | Admitting: Internal Medicine

## 2015-12-18 VITALS — BP 124/86 | HR 80 | Temp 97.9°F | Ht 67.0 in | Wt 181.0 lb

## 2015-12-18 DIAGNOSIS — Z0000 Encounter for general adult medical examination without abnormal findings: Secondary | ICD-10-CM

## 2015-12-18 DIAGNOSIS — E785 Hyperlipidemia, unspecified: Secondary | ICD-10-CM

## 2015-12-18 MED ORDER — METOPROLOL TARTRATE 25 MG PO TABS: 25.0000 mg | ORAL_TABLET | Freq: Two times a day (BID) | ORAL | Status: DC

## 2015-12-18 MED ORDER — VALACYCLOVIR HCL 1 G PO TABS: 1000.0000 mg | ORAL_TABLET | Freq: Three times a day (TID) | ORAL | Status: DC

## 2015-12-18 MED ORDER — FLUTICASONE PROPIONATE 50 MCG/ACT NA SUSP: 2.0000 | Freq: Every day | NASAL | Status: DC

## 2015-12-18 NOTE — Assessment & Plan Note (Addendum)
Marked improvement with current statin,  to f/u any worsening symptoms or concerns, stable overall by history and exam, recent data reviewed with pt, and pt to continue medical treatment as before,  to f/u any worsening symptoms or concerns Lab Results  Component Value Date   LDLCALC 86 12/14/2015

## 2015-12-18 NOTE — Progress Notes (Signed)
Subjective:    Patient ID: Martin Walker, male    DOB: 01-29-1956, 59 y.o.   MRN: [id number]  HPI  Here for wellness and f/u;  Overall doing ok;  Pt denies Chest pain, worsening SOB, DOE, wheezing, orthopnea, PND, worsening LE edema, palpitations, dizziness or syncope.  Pt denies neurological change such as new headache, facial or extremity weakness.  Pt denies polydipsia, polyuria, or low sugar symptoms. Pt states overall good compliance with treatment and medications, good tolerability, and has been trying to follow appropriate diet.  Pt denies worsening depressive symptoms, suicidal ideation or panic. No fever, night sweats, wt loss, loss of appetite, or other constitutional symptoms.  Pt states good ability with ADL's, has low fall risk, home safety reviewed and adequate, no other significant changes in hearing or vision, and occasionally active with exercise No current complaints Past Medical History  Diagnosis Date  . Rhinitis, allergic     skin test pos 11/26/08- dust,cat  . Rhinosinusitis     recurrent  . Asthma     chilhood   Past Surgical History  Procedure Laterality Date  . Left knee    . Right wrist    . Rotator cuff repair      left  . Colonoscopy      reports that he quit smoking about 4 years ago. His smoking use included Cigarettes. He has a 52.5 pack-year smoking history. He has quit using smokeless tobacco. He reports that he drinks alcohol. He reports that he uses illicit drugs (Marijuana). family history includes Heart disease in his father; Hypertension in his mother; Thyroid disease in his mother. Allergies  Allergen Reactions  . Codeine     Causes peeling of the hands   Current Outpatient Prescriptions on File Prior to Visit  Medication Sig Dispense Refill  . ALPRAZolam (XANAX) 0.25 MG tablet 1 every 6 hrs as needed for heart racing 30 tablet 2  . ALREX 0.2 % SUSP As directed    . atorvastatin (LIPITOR) 20 MG tablet TAKE ONE TABLET BY MOUTH ONCE DAILY  90 tablet 1  . cyclobenzaprine (FLEXERIL) 5 MG tablet Take 1 tablet (5 mg total) by mouth 3 (three) times daily as needed for muscle spasms. 60 tablet 11   No current facility-administered medications on file prior to visit.   Review of Systems Constitutional: Negative for increased diaphoresis, other activity, appetite or siginficant weight change other than noted HENT: Negative for worsening hearing loss, ear pain, facial swelling, mouth sores and neck stiffness.   Eyes: Negative for other worsening pain, redness or visual disturbance.  Respiratory: Negative for shortness of breath and wheezing  Cardiovascular: Negative for chest pain and palpitations.  Gastrointestinal: Negative for diarrhea, blood in stool, abdominal distention or other pain Genitourinary: Negative for hematuria, flank pain or change in urine volume.  Musculoskeletal: Negative for myalgias or other joint complaints.  Skin: Negative for color change and wound or drainage.  Neurological: Negative for syncope and numbness. other than noted Hematological: Negative for adenopathy. or other swelling Psychiatric/Behavioral: Negative for hallucinations, SI, self-injury, decreased concentration or other worsening agitation.      Objective:   Physical Exam BP 124/86 mmHg  Pulse 80  Temp(Src) 97.9 F (36.6 C) (Oral)  Ht 5\' 7"  (1.702 m)  Wt 181 lb (82.101 kg)  BMI 28.34 kg/m2  SpO2 98% VS noted,  Constitutional: Pt is oriented to person, place, and time. Appears well-developed and well-nourished, in no significant distress Head: Normocephalic and atraumatic.  Right Ear: External ear normal.  Left Ear: External ear normal.  Nose: Nose normal.  Mouth/Throat: Oropharynx is clear and moist.  Eyes: Conjunctivae and EOM are normal. Pupils are equal, round, and reactive to light.  Neck: Normal range of motion. Neck supple. No JVD present. No tracheal deviation present or significant neck LA or mass Cardiovascular: Normal  rate, regular rhythm, normal heart sounds and intact distal pulses.   Pulmonary/Chest: Effort normal and breath sounds without rales or wheezing  Abdominal: Soft. Bowel sounds are normal. NT. No HSM  Musculoskeletal: Normal range of motion. Exhibits no edema.  Lymphadenopathy:  Has no cervical adenopathy.  Neurological: Pt is alert and oriented to person, place, and time. Pt has normal reflexes. No cranial nerve deficit. Motor grossly intact Skin: Skin is warm and dry. No rash noted.  Psychiatric:  Has normal mood and affect. Behavior is normal.      Assessment & Plan:

## 2015-12-18 NOTE — Assessment & Plan Note (Signed)

## 2015-12-18 NOTE — Progress Notes (Signed)
Pre visit review using our clinic review tool, if applicable. No additional management support is needed unless otherwise documented below in the visit note. 

## 2015-12-18 NOTE — Patient Instructions (Signed)
Please continue all other medications as before, and refills have been done if requested.  Please have the pharmacy call with any other refills you may need.  Please continue your efforts at being more active, low cholesterol diet, and weight control.  You are otherwise up to date with prevention measures today.  Please keep your appointments with your specialists as you may have planned  Please return in 1 year for your yearly visit, or sooner if needed, with Lab testing done 3-5 days before  

## 2016-02-22 ENCOUNTER — Other Ambulatory Visit: Payer: Self-pay | Admitting: Internal Medicine

## 2016-02-23 NOTE — Telephone Encounter (Signed)
Please advise patient is requesting refill on flexeril and Dr.John is out of the office until Monday

## 2016-05-31 ENCOUNTER — Other Ambulatory Visit: Payer: Self-pay | Admitting: Internal Medicine

## 2016-07-13 ENCOUNTER — Ambulatory Visit (INDEPENDENT_AMBULATORY_CARE_PROVIDER_SITE_OTHER): Payer: Federal, State, Local not specified - PPO | Admitting: Internal Medicine

## 2016-07-13 ENCOUNTER — Other Ambulatory Visit: Payer: Federal, State, Local not specified - PPO

## 2016-07-13 ENCOUNTER — Encounter: Payer: Self-pay | Admitting: Internal Medicine

## 2016-07-13 VITALS — BP 130/70 | HR 64 | Temp 98.8°F | Resp 20 | Wt 180.0 lb

## 2016-07-13 DIAGNOSIS — L02419 Cutaneous abscess of limb, unspecified: Secondary | ICD-10-CM | POA: Insufficient documentation

## 2016-07-13 DIAGNOSIS — Z0000 Encounter for general adult medical examination without abnormal findings: Secondary | ICD-10-CM

## 2016-07-13 MED ORDER — CEFTRIAXONE SODIUM 1 G IJ SOLR
1.0000 g | Freq: Once | INTRAMUSCULAR | Status: AC
  Administered 2016-07-13: 1 g via INTRAMUSCULAR

## 2016-07-13 MED ORDER — SULFAMETHOXAZOLE-TRIMETHOPRIM 800-160 MG PO TABS: 1.0000 | ORAL_TABLET | Freq: Two times a day (BID) | ORAL | Status: DC

## 2016-07-13 NOTE — Progress Notes (Signed)
Pre visit review using our clinic review tool, if applicable. No additional management support is needed unless otherwise documented below in the visit note. 

## 2016-07-13 NOTE — Patient Instructions (Signed)
You had the antibiotic shot today  Please take all new medication as prescribed - the antibiotic  Please continue all other medications as before, and refills have been done if requested.  Please have the pharmacy call with any other refills you may need.  Please keep your appointments with your specialists as you may have planned  You will be contacted regarding the referral for: [facility]  You are given the work note

## 2016-07-13 NOTE — Progress Notes (Signed)
Subjective:    Patient ID: Martin Walker, male    DOB: 1956/07/14, 60 y.o.   MRN: [id number]  HPI  Here with c/o right wrist area x 2-3 days rapidly worsening red/swelling/tender without frank drainage, fever, red streaks; or chills' has no prior hx of similar or prior hx of mrsa or immune deficiency.  No trauma.  Thought might have been a spider bite to start but never saw an insect or bite site.  Nohting makes better or worse Past Medical History  Diagnosis Date  . Rhinitis, allergic     skin test pos 11/26/08- dust,cat  . Rhinosinusitis     recurrent  . Asthma     chilhood   Past Surgical History  Procedure Laterality Date  . Left knee    . Right wrist    . Rotator cuff repair      left  . Colonoscopy      reports that he quit smoking about 5 years ago. His smoking use included Cigarettes. He has a 52.5 pack-year smoking history. He has quit using smokeless tobacco. He reports that he drinks alcohol. He reports that he uses illicit drugs (Marijuana). family history includes Heart disease in his father; Hypertension in his mother; Thyroid disease in his mother. Allergies  Allergen Reactions  . Codeine     Causes peeling of the hands   Current Outpatient Prescriptions on File Prior to Visit  Medication Sig Dispense Refill  . ALPRAZolam (XANAX) 0.25 MG tablet 1 every 6 hrs as needed for heart racing 30 tablet 2  . ALREX 0.2 % SUSP As directed    . atorvastatin (LIPITOR) 20 MG tablet TAKE ONE TABLET BY MOUTH ONCE DAILY 90 tablet 0  . cyclobenzaprine (FLEXERIL) 5 MG tablet TAKE ONE TABLET BY MOUTH THREE TIMES DAILY AS NEEDED FOR MUSCLE SPASM 60 tablet 0  . fluticasone (FLONASE) 50 MCG/ACT nasal spray Place 2 sprays into both nostrils daily. 16 g 11  . metoprolol tartrate (LOPRESSOR) 25 MG tablet Take 1 tablet (25 mg total) by mouth 2 (two) times daily. 180 tablet 3  . valACYclovir (VALTREX) 1000 MG tablet Take 1 tablet (1,000 mg total) by mouth 3 (three) times daily. As  needed for cold sore episodes 21 tablet 3   No current facility-administered medications on file prior to visit.    Review of Systems  Constitutional: Negative for unusual diaphoresis or night sweats HENT: Negative for ear swelling or discharge Eyes: Negative for worsening visual haziness  Respiratory: Negative for choking and stridor.   Gastrointestinal: Negative for distension or worsening eructation Genitourinary: Negative for retention or change in urine volume.  Musculoskeletal: Negative for other MSK pain or swelling Skin: Negative for color change and worsening wound Neurological: Negative for tremors and numbness other than noted  Psychiatric/Behavioral: Negative for decreased concentration or agitation other than above       Objective:   Physical Exam BP 130/70 mmHg  Pulse 64  Temp(Src) 98.8 F (37.1 C) (Oral)  Resp 20  Wt 180 lb (81.647 kg)  SpO2 97% VS noted, non toxic, not ill appearing Constitutional: Pt appears in no apparent distress HENT: Head: NCAT.  Right Ear: External ear normal.  Left Ear: External ear normal.  Eyes: . Pupils are equal, round, and reactive to light. Conjunctivae and EOM are normal Neck: Normal range of motion. Neck supple.  Cardiovascular: Normal rate and regular rhythm.   Pulmonary/Chest: Effort normal and breath sounds without rales or wheezing.  Abd:  Soft, NT, ND, + BS Neurological: Pt is alert. Not confused , motor grossly intact Skin: Skin is warm. No rash, no LE edema, right wrist with 2 cm area red/tender/swelling with ? Mild fluctuance, no drainage Psychiatric: Pt behavior is normal. No agitation.     Assessment & Plan:

## 2016-07-14 LAB — HEPATITIS C ANTIBODY: HCV Ab: NEGATIVE

## 2016-07-14 NOTE — Assessment & Plan Note (Signed)
Mild to mod, for antibx course,  To refer hand surgeon for furthe tx, to f/u any worsening symptoms or concerns

## 2016-07-18 DIAGNOSIS — M67431 Ganglion, right wrist: Secondary | ICD-10-CM | POA: Insufficient documentation

## 2016-07-25 DIAGNOSIS — M67431 Ganglion, right wrist: Secondary | ICD-10-CM | POA: Diagnosis not present

## 2016-07-28 DIAGNOSIS — M67431 Ganglion, right wrist: Secondary | ICD-10-CM | POA: Diagnosis not present

## 2016-07-29 ENCOUNTER — Other Ambulatory Visit: Payer: Self-pay | Admitting: Orthopedic Surgery

## 2016-07-29 DIAGNOSIS — M67431 Ganglion, right wrist: Secondary | ICD-10-CM | POA: Diagnosis not present

## 2016-07-29 DIAGNOSIS — D481 Neoplasm of uncertain behavior of connective and other soft tissue: Secondary | ICD-10-CM | POA: Diagnosis not present

## 2016-07-29 DIAGNOSIS — M7989 Other specified soft tissue disorders: Secondary | ICD-10-CM | POA: Diagnosis not present

## 2016-09-01 ENCOUNTER — Other Ambulatory Visit: Payer: Self-pay | Admitting: Internal Medicine

## 2016-10-06 ENCOUNTER — Ambulatory Visit (INDEPENDENT_AMBULATORY_CARE_PROVIDER_SITE_OTHER): Payer: Federal, State, Local not specified - PPO

## 2016-10-06 DIAGNOSIS — Z23 Encounter for immunization: Secondary | ICD-10-CM

## 2016-11-23 NOTE — Progress Notes (Signed)
  HPI The patient presents for evaluation of palpitations. Since I last saw him he has had increasing stress at work.  With this he has significant palpitations.  He feels his heart racing and skipping.  He feels presyncopal.  He denies chest pain or neck pain.  He does get some SOB with this.  He will on occasion take an extra metoprolol for his increased heart rate.    Allergies  Allergen Reactions  . Codeine     Causes peeling of the hands    Current Outpatient Prescriptions  Medication Sig Dispense Refill  . ALPRAZolam (XANAX) 0.25 MG tablet 1 every 6 hrs as needed for heart racing 30 tablet 2  . ALREX 0.2 % SUSP As directed    . atorvastatin (LIPITOR) 20 MG tablet TAKE ONE TABLET BY MOUTH ONCE DAILY 90 tablet 0  . cyclobenzaprine (FLEXERIL) 5 MG tablet TAKE ONE TABLET BY MOUTH THREE TIMES DAILY AS NEEDED FOR MUSCLE SPASM 60 tablet 0  . fluticasone (FLONASE) 50 MCG/ACT nasal spray Place 2 sprays into both nostrils daily. 16 g 11  . metoprolol tartrate (LOPRESSOR) 25 MG tablet Take 1 tablet (25 mg total) by mouth 2 (two) times daily. 180 tablet 3  . valACYclovir (VALTREX) 1000 MG tablet Take 1 tablet (1,000 mg total) by mouth 3 (three) times daily. As needed for cold sore episodes 21 tablet 3   No current facility-administered medications for this visit.     Past Medical History:  Diagnosis Date  . Asthma    chilhood  . Rhinitis, allergic    skin test pos 11/26/08- dust,cat  . Rhinosinusitis    recurrent    Past Surgical History:  Procedure Laterality Date  . COLONOSCOPY    . left knee    . right wrist    . ROTATOR CUFF REPAIR     left    ROS:    As stated in the HPI and negative for all other systems.  PHYSICAL EXAM BP (!) 142/82   Pulse 83   Ht 5\' 7"  (1.702 m)   Wt 172 lb (78 kg)   BMI 26.94 kg/m  GENERAL:  Well appearing NECK:  No jugular venous distention, waveform within normal limits, carotid upstroke brisk and symmetric, no bruits, no thyromegaly LUNGS:   Clear to auscultation bilaterally HEART:  PMI not displaced or sustained,S1 and S2 within normal limits, no S3, no S4, no clicks, no rubs, no murmurs ABD:  Flat, positive bowel sounds normal in frequency in pitch, no bruits, no rebound, no guarding, no midline pulsatile mass, no hepatomegaly, no splenomegaly EXT:  2 plus pulses throughout, no edema, no cyanosis no clubbing  EKG:  Sinus rhythm, rate 83, axis within normal limits, intervals within normal limits, no acute ST-T wave changes with PACs.   12/16/14  ASSESSMENT AND PLAN  Palpitations -  He has palpitations only related to stress.   He was given a letter that states that the patient has a heart rhythm issue that is exacerbated by stress.  He needs to have a job atmosphere that does not foster emotional stress or anxiety.    TOBACCO USE DISORDER -  He remains off of cigarettes.   Dyslipidemia - I have reviewed his lipids. His LDL was 86.  This is much improved.  He can continue the meds as listed.

## 2016-11-24 ENCOUNTER — Encounter: Payer: Self-pay | Admitting: Cardiology

## 2016-11-24 ENCOUNTER — Ambulatory Visit (INDEPENDENT_AMBULATORY_CARE_PROVIDER_SITE_OTHER): Payer: Federal, State, Local not specified - PPO | Admitting: Cardiology

## 2016-11-24 VITALS — BP 142/82 | HR 83 | Ht 67.0 in | Wt 172.0 lb

## 2016-11-24 DIAGNOSIS — R002 Palpitations: Secondary | ICD-10-CM | POA: Diagnosis not present

## 2016-11-24 NOTE — Patient Instructions (Addendum)

## 2016-12-04 ENCOUNTER — Other Ambulatory Visit: Payer: Self-pay | Admitting: Internal Medicine

## 2016-12-04 ENCOUNTER — Other Ambulatory Visit: Payer: Self-pay | Admitting: Family

## 2016-12-07 ENCOUNTER — Other Ambulatory Visit: Payer: Federal, State, Local not specified - PPO

## 2016-12-14 ENCOUNTER — Telehealth: Payer: Self-pay

## 2016-12-14 ENCOUNTER — Other Ambulatory Visit (INDEPENDENT_AMBULATORY_CARE_PROVIDER_SITE_OTHER): Payer: Federal, State, Local not specified - PPO

## 2016-12-14 DIAGNOSIS — Z0000 Encounter for general adult medical examination without abnormal findings: Secondary | ICD-10-CM | POA: Diagnosis not present

## 2016-12-14 LAB — CBC
HCT: 44.3 % (ref 39.0–52.0)
Hemoglobin: 15.3 g/dL (ref 13.0–17.0)
MCHC: 34.5 g/dL (ref 30.0–36.0)
MCV: 91.3 fl (ref 78.0–100.0)
PLATELETS: 312 10*3/uL (ref 150.0–400.0)
RBC: 4.85 Mil/uL (ref 4.22–5.81)
RDW: 12.4 % (ref 11.5–15.5)
WBC: 6.8 10*3/uL (ref 4.0–10.5)

## 2016-12-14 LAB — HEPATIC FUNCTION PANEL
ALK PHOS: 87 U/L (ref 39–117)
ALT: 27 U/L (ref 0–53)
AST: 22 U/L (ref 0–37)
Albumin: 4.5 g/dL (ref 3.5–5.2)
BILIRUBIN DIRECT: 0.1 mg/dL (ref 0.0–0.3)
TOTAL PROTEIN: 7.9 g/dL (ref 6.0–8.3)
Total Bilirubin: 0.8 mg/dL (ref 0.2–1.2)

## 2016-12-14 LAB — LIPID PANEL
CHOLESTEROL: 146 mg/dL (ref 0–200)
HDL: 38.3 mg/dL — ABNORMAL LOW (ref >39.00)
LDL Cholesterol: 94 mg/dL (ref 0–99)
NonHDL: 108.15
Total CHOL/HDL Ratio: 4
Triglycerides: 73 mg/dL (ref 0.0–149.0)
VLDL: 14.6 mg/dL (ref 0.0–40.0)

## 2016-12-14 LAB — BASIC METABOLIC PANEL
BUN: 15 mg/dL (ref 6–23)
CHLORIDE: 103 meq/L (ref 96–112)
CO2: 29 meq/L (ref 19–32)
Calcium: 9.7 mg/dL (ref 8.4–10.5)
Creatinine, Ser: 1.08 mg/dL (ref 0.40–1.50)
GFR: 74.19 mL/min (ref >60.00)
Glucose, Bld: 107 mg/dL — ABNORMAL HIGH (ref 70–99)
POTASSIUM: 4.8 meq/L (ref 3.5–5.1)
SODIUM: 140 meq/L (ref 135–145)

## 2016-12-14 LAB — MICROALBUMIN / CREATININE URINE RATIO
CREATININE, U: 131.1 mg/dL
MICROALB/CREAT RATIO: 0.8 mg/g (ref 0.0–30.0)
Microalb, Ur: 1 mg/dL (ref 0.0–1.9)

## 2016-12-14 LAB — TSH: TSH: 1.03 u[IU]/mL (ref 0.35–4.50)

## 2016-12-14 LAB — HEMOGLOBIN A1C: Hgb A1c MFr Bld: 5.7 % (ref 4.6–6.5)

## 2016-12-14 NOTE — Telephone Encounter (Signed)
Labs entered.

## 2016-12-20 ENCOUNTER — Ambulatory Visit (INDEPENDENT_AMBULATORY_CARE_PROVIDER_SITE_OTHER): Payer: Federal, State, Local not specified - PPO | Admitting: Internal Medicine

## 2016-12-20 VITALS — BP 116/74 | HR 74 | Temp 98.1°F | Resp 16 | Wt 173.0 lb

## 2016-12-20 DIAGNOSIS — Z0000 Encounter for general adult medical examination without abnormal findings: Secondary | ICD-10-CM

## 2016-12-20 MED ORDER — ATORVASTATIN CALCIUM 20 MG PO TABS: 20.0000 mg | ORAL_TABLET | Freq: Every day | ORAL | 3 refills | Status: DC

## 2016-12-20 MED ORDER — ASPIRIN EC 81 MG PO TBEC: 81.0000 mg | DELAYED_RELEASE_TABLET | Freq: Every day | ORAL | 11 refills | Status: AC

## 2016-12-20 NOTE — Progress Notes (Signed)
Subjective:    Patient ID: Martin Walker, male    DOB: 1956/04/01, 60 y.o.   MRN: [id number]  HPI  Here for wellness and f/u;  Overall doing ok;  Pt denies Chest pain, worsening SOB, DOE, wheezing, orthopnea, PND, worsening LE edema, palpitations, dizziness or syncope.  Pt denies neurological change such as new headache, facial or extremity weakness.  Pt denies polydipsia, polyuria, or low sugar symptoms. Pt states overall good compliance with treatment and medications, good tolerability, and has been trying to follow appropriate diet.  Pt denies worsening depressive symptoms, suicidal ideation or panic. No fever, night sweats, wt loss, loss of appetite, or other constitutional symptoms.  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 Not taking currently asa 81 but wiling to start.  Needs doctor note for being here today.   Past Medical History:  Diagnosis Date  . Asthma    chilhood  . Rhinitis, allergic    skin test pos 11/26/08- dust,cat  . Rhinosinusitis    recurrent   Past Surgical History:  Procedure Laterality Date  . COLONOSCOPY    . left knee    . right wrist    . ROTATOR CUFF REPAIR     left    reports that he quit smoking about 5 years ago. His smoking use included Cigarettes. He has a 52.50 pack-year smoking history. He has quit using smokeless tobacco. He reports that he drinks alcohol. He reports that he uses drugs, including Marijuana. family history includes Heart disease in his father; Hypertension in his mother; Thyroid disease in his mother. Allergies  Allergen Reactions  . Codeine     Causes peeling of the hands   Current Outpatient  Prescriptions on File Prior to Visit  Medication Sig Dispense Refill  . ALPRAZolam (XANAX) 0.25 MG tablet 1 every 6 hrs as needed for heart racing 30 tablet 2  . ALREX 0.2 % SUSP As directed    . atorvastatin (LIPITOR) 20 MG tablet Take 1 tablet (20 mg total) by mouth daily. Yearly physical is due must see MD for future refills 30 tablet 0  . cyclobenzaprine (FLEXERIL) 5 MG tablet TAKE ONE TABLET BY MOUTH THREE TIMES DAILY AS NEEDED FOR MUSCLE SPASM 60 tablet 0  . fluticasone (FLONASE) 50 MCG/ACT nasal spray Place 2 sprays into both nostrils daily. 16 g 11  . metoprolol tartrate (LOPRESSOR) 25 MG tablet Take 1 tablet (25 mg total) by mouth 2 (two) times daily. 180 tablet 3  . valACYclovir (VALTREX) 1000 MG tablet Take 1 tablet (1,000 mg total) by mouth 3 (three) times daily. As needed for cold sore episodes 21 tablet 3   No current facility-administered medications on file prior to visit.    Review of Systems  Constitutional: Negative for increased diaphoresis, or other activity, appetite or siginficant weight change other than noted HENT: Negative for worsening hearing loss, ear pain, facial swelling, mouth sores and neck stiffness.   Eyes: Negative for other worsening pain, redness or visual disturbance.  Respiratory: Negative for choking or stridor Cardiovascular: Negative for other chest pain and palpitations.  Gastrointestinal: Negative for worsening diarrhea, blood in stool, or abdominal distention Genitourinary: Negative  for hematuria, flank pain or change in urine volume.  Musculoskeletal: Negative for myalgias or other joint complaints.  Skin: Negative for other color change and wound or drainage.  Neurological: Negative for syncope and numbness. other than noted Hematological: Negative for adenopathy. or other swelling Psychiatric/Behavioral: Negative for hallucinations, SI, self-injury, decreased concentration or other worsening agitation.  All other system neg per pt       Objective:   Physical Exam BP 116/74   Pulse 74   Temp 98.1 F (36.7 C) (Oral)   Resp 16   Wt 173 lb (78.5 kg)   SpO2 98%   BMI 27.10 kg/m  VS noted,  Constitutional: Pt is oriented to person, place, and time. Appears well-developed and well-nourished, in no significant distress Head: Normocephalic and atraumatic  Eyes: Conjunctivae and EOM are normal. Pupils are equal, round, and reactive to light Right Ear: External ear normal.  Left Ear: External ear normal Nose: Nose normal.  Mouth/Throat: Oropharynx is clear and moist  Neck: Normal range of motion. Neck supple. No JVD present. No tracheal deviation present or significant neck LA or mass Cardiovascular: Normal rate, regular rhythm, normal heart sounds and intact distal pulses.   Pulmonary/Chest: Effort normal and breath sounds without rales or wheezing  Abdominal: Soft. Bowel sounds are normal. NT. No HSM  Musculoskeletal: Normal range of motion. Exhibits no edema Lymphadenopathy: Has no cervical adenopathy.  Neurological: Pt is alert and oriented to person, place, and time. Pt has normal reflexes. No cranial nerve deficit. Motor grossly intact Skin: Skin is warm and dry. No rash noted or new ulcers Psychiatric:  Has normal mood and affect. Behavior is normal.  No othe new exam findings    Assessment & Plan:

## 2016-12-20 NOTE — Assessment & Plan Note (Signed)

## 2016-12-20 NOTE — Patient Instructions (Addendum)
Please start Aspirin 81 mg - 1 per day - OTC (enteric coated)  Please continue all other medications as before, and refills have been done if requested - the lipitor  Please have the pharmacy call with any other refills you may need.  Please continue your efforts at being more active, low cholesterol diet, and weight control.  You are otherwise up to date with prevention measures today.  Please keep your appointments with your specialists as you may have planned  Please return in 1 year for your yearly visit, or sooner if needed, with Lab testing done 3-5 days before

## 2016-12-20 NOTE — Progress Notes (Signed)
Pre visit review using our clinic review tool, if applicable. No additional management support is needed unless otherwise documented below in the visit note. 

## 2017-01-20 ENCOUNTER — Other Ambulatory Visit: Payer: Self-pay | Admitting: Internal Medicine

## 2017-02-02 ENCOUNTER — Encounter: Payer: Self-pay | Admitting: Internal Medicine

## 2017-02-02 ENCOUNTER — Ambulatory Visit (INDEPENDENT_AMBULATORY_CARE_PROVIDER_SITE_OTHER): Payer: Federal, State, Local not specified - PPO | Admitting: Internal Medicine

## 2017-02-02 VITALS — BP 120/88 | HR 100 | Temp 99.1°F | Resp 16 | Wt 173.0 lb

## 2017-02-02 DIAGNOSIS — J111 Influenza due to unidentified influenza virus with other respiratory manifestations: Secondary | ICD-10-CM | POA: Diagnosis not present

## 2017-02-02 DIAGNOSIS — J0100 Acute maxillary sinusitis, unspecified: Secondary | ICD-10-CM | POA: Diagnosis not present

## 2017-02-02 DIAGNOSIS — R6889 Other general symptoms and signs: Secondary | ICD-10-CM | POA: Diagnosis not present

## 2017-02-02 LAB — POCT INFLUENZA A/B
INFLUENZA B, POC: POSITIVE — AB
Influenza A, POC: POSITIVE — AB

## 2017-02-02 MED ORDER — ONDANSETRON 4 MG PO TBDP: 4.0000 mg | ORAL_TABLET | Freq: Three times a day (TID) | ORAL | 0 refills | Status: DC | PRN

## 2017-02-02 MED ORDER — AMOXICILLIN-POT CLAVULANATE 875-125 MG PO TABS: 1.0000 | ORAL_TABLET | Freq: Two times a day (BID) | ORAL | 0 refills | Status: DC

## 2017-02-02 MED ORDER — OSELTAMIVIR PHOSPHATE 75 MG PO CAPS: 75.0000 mg | ORAL_CAPSULE | Freq: Two times a day (BID) | ORAL | 0 refills | Status: DC

## 2017-02-02 NOTE — Assessment & Plan Note (Signed)
Concern for bacterial sinus infection on top of influenza He does have a history of frequent bacterial sinus infections We will prescribe an antibiotic-Augmentin twice a day 10 days Rest, fluids Over-the-counter cold medications for symptom relief Note given for work Call if no improvement

## 2017-02-02 NOTE — Progress Notes (Signed)
Subjective:    Patient ID: Martin Walker, male    DOB: August 14, 1956, 61 y.o.   MRN: [id number]  HPI He is here for an acute visit for cold symptoms.  His symptoms started two days ago.  He is experiencing decreased app, Mild nasal congestion and slight right-sided ear pain, no sweets, right-sided sinus pain, sore throat, cough that is just started to become productive, mild shortness of breath, nausea, vomiting, diarrhea, body aches and headaches. He wasn't sure if he had a sinus infection, which she gets a couple times a year.   He has tried taking mucinex.  He has not taken anything else because he has vomited on a couple of occasions and was afraid to take anything. He has been trying to keep up with his fluids, but was afraid to eat anything.   Medications and allergies reviewed with patient and updated if appropriate.  Patient Active Problem List   Diagnosis Date Noted  . Abscess, wrist 07/13/2016  . Gross hematuria 11/08/2012  . Right flank pain 11/08/2012  . 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 Hypertriglyceridemia 10/20/2011  . Hyperlipidemia 10/06/2011  . Preventative health care 10/06/2011  . HEMORRHOIDS 02/04/2011  . Anxiety state, unspecified 01/07/2011  . Hemorrhage of rectum and anus 01/07/2011  . Heartburn 01/07/2011  . HEMATOCHEZIA 01/04/2011  . RHINOSINUSITIS, RECURRENT 10/13/2008  . TOBACCO USE DISORDER/SMOKER-SMOKING CESSATION DISCUSSED 07/02/2008  . GOITER 01/09/2008  . ALLERGIC RHINITIS 01/09/2008  . ASTHMA 01/09/2008  . Palpitations 01/09/2008    Current Outpatient Prescriptions on File Prior to Visit  Medication Sig Dispense Refill  . ALPRAZolam (XANAX) 0.25 MG tablet 1 every 6 hrs as needed for heart racing 30 tablet 2  . ALREX 0.2 % SUSP As directed    . aspirin EC 81 MG tablet Take 1 tablet (81 mg total) by mouth daily. 90 tablet 11  . atorvastatin (LIPITOR) 20 MG tablet Take 1 tablet (20 mg total) by mouth daily. Yearly physical is due must  see MD for future refills 90 tablet 3  . cyclobenzaprine (FLEXERIL) 5 MG tablet TAKE ONE TABLET BY MOUTH THREE TIMES DAILY AS NEEDED FOR MUSCLE SPASM 60 tablet 0  . fluticasone (FLONASE) 50 MCG/ACT nasal spray USE TWO SPRAY(S) IN EACH NOSTRIL ONCE DAILY 16 g 11  . metoprolol tartrate (LOPRESSOR) 25 MG tablet Take 1 tablet (25 mg total) by mouth 2 (two) times daily. 180 tablet 3  . valACYclovir (VALTREX) 1000 MG tablet Take 1 tablet (1,000 mg total) by mouth 3 (three) times daily. As needed for cold sore episodes 21 tablet 3   No current facility-administered medications on file prior to visit.     Past Medical History:  Diagnosis Date  . Asthma    chilhood  . Rhinitis, allergic    skin test pos 11/26/08- dust,cat  . Rhinosinusitis    recurrent    Past Surgical History:  Procedure Laterality Date  . COLONOSCOPY    . left knee    . right wrist    . ROTATOR CUFF REPAIR     left    Social History   Social History  . Marital status: Married    Spouse name: N/A  . Number of children: N/A  . Years of education: N/A   Occupational History  . Post [employer]    Social History Main Topics  . Smoking status: Former Smoker    Packs/day: 1.50    Years: 35.00    Types: Cigarettes  Quit date: 04/24/2011  . Smokeless tobacco: Former Systems developer     Comment: quit in early 20's  . Alcohol use Yes     Comment: 6 beers/ year  . Drug use: Yes    Types: Marijuana     Comment: age 72 - 36 very little  . Sexual activity: Not on file   Other Topics Concern  . 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        Family History  Problem Relation Age of Onset  . Hypertension Mother   . Thyroid disease Mother   . Heart disease Father   . Lung cancer      GRANDFATHER  . Colon cancer      UNCLE  . Other      GRANDMOTHER WITH BRAIN TUMOR    Review of Systems  Constitutional: Positive for  appetite change, chills and fatigue. Negative for fever.  HENT: Positive for congestion (mild), ear pain (mild, right), nosebleeds, sinus pain (right maxillary) and sore throat.        Teeth pain  Respiratory: Positive for cough (productive now) and shortness of breath. Negative for wheezing.   Gastrointestinal: Positive for diarrhea, nausea and vomiting.  Musculoskeletal: Positive for arthralgias and myalgias.  Neurological: Positive for headaches.       Objective:   Vitals:   02/02/17 1114  BP: 120/88  Pulse: 100  Resp: 16  Temp: 99.1 F (37.3 C)   Filed Weights   02/02/17 1114  Weight: 173 lb (78.5 kg)   Body mass index is 27.1 kg/m.  Wt Readings from Last 3 Encounters:  02/02/17 173 lb (78.5 kg)  12/20/16 173 lb (78.5 kg)  11/24/16 172 lb (78 kg)     Physical Exam GENERAL APPEARANCE: Appears stated age, Mildly ill appearing, NAD EYES: conjunctiva clear, no icterus HEENT: bilateral tympanic membranes and ear canals normal, oropharynx with no erythema, no thyromegaly, trachea midline, no cervical or supraclavicular lymphadenopathy LUNGS: Clear to auscultation without wheeze or crackles, unlabored breathing, good air entry bilaterally Abdomen: Soft, nontender, nondistended HEART: Normal S1,S2 without murmurs EXTREMITIES: Without clubbing, cyanosis, or edema      Assessment & Plan:   See Problem List for Assessment and Plan of chronic medical problems.

## 2017-02-02 NOTE — Patient Instructions (Signed)
Start tamiflu, the antibiotic and use the anti-nausea medication as needed.   Your prescription(s) have been submitted to your pharmacy or been printed and provided for you. Please take as directed and contact our office if you believe you are having problem(s) with the medication(s) or have any questions.  If your symptoms worsen or fail to improve, please contact our office for further instruction, or in case of emergency go directly to the emergency room at the closest medical facility.   General Recommendations:    Please drink plenty of fluids.  Get plenty of rest   Sleep in humidified air  Use saline nasal sprays  Netti pot  OTC Medications:  Decongestants - helps relieve congestion   Flonase (generic fluticasone) or Nasacort (generic triamcinolone) - please make sure to use the "cross-over" technique at a 45 degree angle towards the opposite eye as opposed to straight up the nasal passageway.   Sudafed (generic pseudoephedrine - Note this is the one that is available behind the pharmacy counter); Products with phenylephrine (-PE) may also be used but is often not as effective as pseudoephedrine.   If you have HIGH BLOOD PRESSURE - Coricidin HBP; AVOID any product that is -D as this contains pseudoephedrine which may increase your blood pressure.  Afrin (oxymetazoline) every 6-8 hours for up to 3 days.  Allergies - helps relieve runny nose, itchy eyes and sneezing   Claritin (generic loratidine), Allegra (fexofenidine), or Zyrtec (generic cyrterizine) for runny nose. These medications should not cause drowsiness.  Note - Benadryl (generic diphenhydramine) may be used however may cause drowsiness  Cough -   Delsym or Robitussin (generic dextromethorphan)  Expectorants - helps loosen mucus to ease removal   Mucinex (generic guaifenesin) as directed on the package.  Headaches / General Aches   Tylenol (generic acetaminophen) - DO NOT EXCEED 3  grams (3,000 mg) in a 24 hour time period  Advil/Motrin (generic ibuprofen)  Sore Throat -   Salt water gargle   Chloraseptic (generic benzocaine) spray or lozenges / Sucrets (generic dyclonine)

## 2017-02-02 NOTE — Progress Notes (Signed)
Pre visit review using our clinic review tool, if applicable. No additional management support is needed unless otherwise documented below in the visit note. 

## 2017-02-02 NOTE — Assessment & Plan Note (Signed)
Rapid flu positive Tamiflu 75 mg twice a day 5 days Rest, fluids We'll prescribe an antinausea medication since he has been experiencing nausea, vomiting Discussed over-the-counter cold medications that may help with some of his symptoms Call or return if symptoms do not improve

## 2017-02-09 ENCOUNTER — Encounter: Payer: Self-pay | Admitting: Emergency Medicine

## 2017-03-16 ENCOUNTER — Other Ambulatory Visit: Payer: Self-pay | Admitting: Internal Medicine

## 2017-08-29 ENCOUNTER — Encounter: Payer: Self-pay | Admitting: Internal Medicine

## 2017-08-29 ENCOUNTER — Ambulatory Visit (INDEPENDENT_AMBULATORY_CARE_PROVIDER_SITE_OTHER): Payer: Federal, State, Local not specified - PPO | Admitting: Internal Medicine

## 2017-08-29 ENCOUNTER — Ambulatory Visit: Payer: Federal, State, Local not specified - PPO | Admitting: Family Medicine

## 2017-08-29 VITALS — BP 174/110 | HR 82 | Temp 98.6°F | Ht 67.0 in | Wt 177.0 lb

## 2017-08-29 DIAGNOSIS — M542 Cervicalgia: Secondary | ICD-10-CM | POA: Diagnosis not present

## 2017-08-29 DIAGNOSIS — M25511 Pain in right shoulder: Secondary | ICD-10-CM | POA: Diagnosis not present

## 2017-08-29 DIAGNOSIS — F411 Generalized anxiety disorder: Secondary | ICD-10-CM

## 2017-08-29 MED ORDER — ALPRAZOLAM 0.25 MG PO TABS: ORAL_TABLET | ORAL | 2 refills | Status: DC

## 2017-08-29 NOTE — Assessment & Plan Note (Signed)
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

## 2017-08-29 NOTE — Assessment & Plan Note (Signed)
Now post traumatic, d/w pt, ok for work note, but also to see Sports Medicine today

## 2017-08-29 NOTE — Patient Instructions (Signed)
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  Please have the pharmacy call with any other refills you may need.  Please keep your appointments with your specialists as you may have planned  Please also see Dr Raeford Razor today at [phone] for the right shoulder and neck pain

## 2017-08-29 NOTE — Assessment & Plan Note (Signed)
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
## 2017-09-01 ENCOUNTER — Ambulatory Visit (INDEPENDENT_AMBULATORY_CARE_PROVIDER_SITE_OTHER): Payer: Federal, State, Local not specified - PPO | Admitting: Family Medicine

## 2017-09-01 ENCOUNTER — Encounter: Payer: Self-pay | Admitting: Family Medicine

## 2017-09-01 ENCOUNTER — Telehealth: Payer: Self-pay | Admitting: Family Medicine

## 2017-09-01 ENCOUNTER — Ambulatory Visit (INDEPENDENT_AMBULATORY_CARE_PROVIDER_SITE_OTHER)
Admission: RE | Admit: 2017-09-01 | Discharge: 2017-09-01 | Disposition: A | Discharge status: Home / Self Care | Payer: Federal, State, Local not specified - PPO | Source: Ambulatory Visit | Attending: Family Medicine | Admitting: Family Medicine

## 2017-09-01 VITALS — BP 132/90 | HR 64 | Temp 98.3°F | Ht 67.0 in | Wt 177.0 lb

## 2017-09-01 DIAGNOSIS — M25511 Pain in right shoulder: Secondary | ICD-10-CM

## 2017-09-01 MED ORDER — MELOXICAM 15 MG PO TABS: 15.0000 mg | ORAL_TABLET | Freq: Every day | ORAL | 0 refills | Status: DC

## 2017-09-01 NOTE — Assessment & Plan Note (Addendum)
He sustained an injury while he was at work. This is an exacerbation of an underlying arthritic change of his acromioclavicular joint versus irritation of a subacromial bursitis. Some findings seem to be consistent with possible labral pathology on clinical exam. - X-rays today - Mobic  - Referral to physical therapy - Worked note provided - Follow-up in 3 weeks. If no improvement would consider an injection into the supraspinatus bursa versus acromioclavicular joint. Consider obtaining an MRI of the shoulder

## 2017-09-01 NOTE — Telephone Encounter (Signed)
Left VM for patient. If he calls back please have him speak with a nurse/CMA and inform that his xrays look normal.   If any questions then please take the best time and phone number to call and I will try to call him back.   Rosemarie Ax, MD [facility] 09/01/2017, 5:04 PM

## 2017-09-01 NOTE — Patient Instructions (Addendum)
Thank you for coming in,   Please follow up with me in three weeks. Please take the mobic 10 days straight and then as needed.    Please feel free to call with any questions or concerns at any time, at [phone]. --Dr. Raeford Razor
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
## 2017-09-04 NOTE — Telephone Encounter (Signed)
Patient has been informed.

## 2017-09-06 ENCOUNTER — Ambulatory Visit: Payer: Federal, State, Local not specified - PPO | Admitting: Physical Therapy

## 2017-09-18 ENCOUNTER — Ambulatory Visit (INDEPENDENT_AMBULATORY_CARE_PROVIDER_SITE_OTHER): Payer: Federal, State, Local not specified - PPO | Admitting: Family Medicine

## 2017-09-18 ENCOUNTER — Encounter: Payer: Self-pay | Admitting: Family Medicine

## 2017-09-18 DIAGNOSIS — M25511 Pain in right shoulder: Secondary | ICD-10-CM

## 2017-09-18 NOTE — Assessment & Plan Note (Signed)
It appears that his symptoms are most consistent with a labral pathology. It feels like an ache deep in his shoulder. - Advised that he needs to speak with his HR and his case manager in order to have this handled through [employer]. - Follow-up as needed

## 2017-09-18 NOTE — Patient Instructions (Signed)
Thank you for coming in,   Please speak with your HR office to make sure the worker's comp case is done correctly.    Please feel free to call with any questions or concerns at any time, at [phone]. --Dr. Raeford Razor
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
## 2017-09-22 ENCOUNTER — Ambulatory Visit: Payer: Self-pay | Admitting: Family Medicine

## 2017-10-02 ENCOUNTER — Telehealth: Payer: Self-pay | Admitting: Emergency Medicine

## 2017-10-02 DIAGNOSIS — M25511 Pain in right shoulder: Secondary | ICD-10-CM

## 2017-10-02 NOTE — Telephone Encounter (Signed)
VM not accepting messages/thx dmf

## 2017-10-02 NOTE — Telephone Encounter (Signed)
Pt came in and stated he is not going to go through workers comp and wants to know if he can [facility]. Please advise thanks.

## 2017-10-06 ENCOUNTER — Ambulatory Visit: Payer: Self-pay

## 2017-10-11 ENCOUNTER — Ambulatory Visit (INDEPENDENT_AMBULATORY_CARE_PROVIDER_SITE_OTHER): Payer: Federal, State, Local not specified - PPO

## 2017-10-11 ENCOUNTER — Ambulatory Visit (INDEPENDENT_AMBULATORY_CARE_PROVIDER_SITE_OTHER): Payer: Federal, State, Local not specified - PPO | Admitting: Licensed Clinical Social Worker

## 2017-10-11 DIAGNOSIS — F4310 Post-traumatic stress disorder, unspecified: Secondary | ICD-10-CM | POA: Diagnosis not present

## 2017-10-11 DIAGNOSIS — Z23 Encounter for immunization: Secondary | ICD-10-CM

## 2017-10-11 DIAGNOSIS — F321 Major depressive disorder, single episode, moderate: Secondary | ICD-10-CM | POA: Diagnosis not present

## 2017-10-13 DIAGNOSIS — S40011A Contusion of right shoulder, initial encounter: Secondary | ICD-10-CM | POA: Diagnosis not present

## 2017-10-25 ENCOUNTER — Ambulatory Visit: Payer: Federal, State, Local not specified - PPO | Admitting: Licensed Clinical Social Worker

## 2017-10-27 DIAGNOSIS — L821 Other seborrheic keratosis: Secondary | ICD-10-CM | POA: Diagnosis not present

## 2017-10-27 DIAGNOSIS — C44319 Basal cell carcinoma of skin of other parts of face: Secondary | ICD-10-CM | POA: Diagnosis not present

## 2017-11-03 DIAGNOSIS — M67911 Unspecified disorder of synovium and tendon, right shoulder: Secondary | ICD-10-CM | POA: Diagnosis not present

## 2017-11-22 ENCOUNTER — Encounter: Payer: Self-pay | Admitting: Cardiology
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
## 2017-11-30 ENCOUNTER — Ambulatory Visit (INDEPENDENT_AMBULATORY_CARE_PROVIDER_SITE_OTHER): Payer: Federal, State, Local not specified - PPO | Admitting: Cardiology

## 2017-11-30 ENCOUNTER — Encounter: Payer: Self-pay | Admitting: Cardiology

## 2017-11-30 VITALS — BP 146/90 | HR 57 | Ht 67.0 in | Wt 177.0 lb

## 2017-11-30 DIAGNOSIS — E785 Hyperlipidemia, unspecified: Secondary | ICD-10-CM | POA: Diagnosis not present

## 2017-11-30 DIAGNOSIS — R002 Palpitations: Secondary | ICD-10-CM | POA: Diagnosis not present

## 2017-11-30 DIAGNOSIS — I10 Essential (primary) hypertension: Secondary | ICD-10-CM

## 2017-11-30 NOTE — Patient Instructions (Signed)
Medication Instructions:  Continue current medications  If you need a refill on your cardiac medications before your next appointment, please call your pharmacy.  Labwork: None Ordered  Testing/Procedures: None Ordered  Special Instructions:  [facility] Blood pressure Cuff  Follow-Up: Your physician wants you to follow-up in: 1 Year. You should receive a reminder letter in the mail two months in advance. If you do not receive a letter, please call our office [phone].    Thank you for choosing [facility]!!

## 2017-12-21 ENCOUNTER — Telehealth: Payer: Self-pay | Admitting: Cardiology

## 2017-12-21 ENCOUNTER — Encounter: Payer: Self-pay | Admitting: Internal Medicine

## 2017-12-21 ENCOUNTER — Ambulatory Visit (INDEPENDENT_AMBULATORY_CARE_PROVIDER_SITE_OTHER): Payer: Federal, State, Local not specified - PPO | Admitting: Internal Medicine

## 2017-12-21 VITALS — BP 134/88 | HR 62 | Temp 97.7°F | Ht 67.0 in | Wt 178.0 lb

## 2017-12-21 DIAGNOSIS — R0789 Other chest pain: Secondary | ICD-10-CM

## 2017-12-21 DIAGNOSIS — Z0000 Encounter for general adult medical examination without abnormal findings: Secondary | ICD-10-CM | POA: Diagnosis not present

## 2017-12-21 DIAGNOSIS — R739 Hyperglycemia, unspecified: Secondary | ICD-10-CM

## 2017-12-21 DIAGNOSIS — Z114 Encounter for screening for human immunodeficiency virus [HIV]: Secondary | ICD-10-CM

## 2017-12-21 MED ORDER — METOPROLOL TARTRATE 25 MG PO TABS: 25.0000 mg | ORAL_TABLET | Freq: Two times a day (BID) | ORAL | 3 refills | Status: DC

## 2017-12-21 MED ORDER — ATORVASTATIN CALCIUM 20 MG PO TABS: 20.0000 mg | ORAL_TABLET | Freq: Every day | ORAL | 3 refills | Status: DC

## 2017-12-21 MED ORDER — ALPRAZOLAM 0.25 MG PO TABS: ORAL_TABLET | ORAL | 2 refills | Status: DC

## 2017-12-21 MED ORDER — CYCLOBENZAPRINE HCL 5 MG PO TABS: 5.0000 mg | ORAL_TABLET | Freq: Three times a day (TID) | ORAL | 2 refills | Status: DC | PRN

## 2017-12-21 MED ORDER — FLUTICASONE PROPIONATE 50 MCG/ACT NA SUSP: NASAL | 11 refills | Status: DC

## 2017-12-21 NOTE — Patient Instructions (Signed)

## 2017-12-21 NOTE — Telephone Encounter (Signed)
New Message   Patient states that Dr. Percival Spanish wanted him to have a stress test. Not showing a order in the system. If its able to be scheduled he wants it on 1/22 or 2/19. Please call.
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
## 2017-12-21 NOTE — Telephone Encounter (Signed)
Per MD notes at most recent OV (11/30/17):  Chest Pain -  This  is right shoulder pain and atypical.  I will bring the patient back for a POET (Plain Old Exercise Test). This will allow me to screen for obstructive coronary disease, risk stratify and very importantly provide a prescription for exercise.  He will call me when he wants to have this done.   ---------------  Order for GXT (POET) entered. Please communicate with patient for his scheduling needs.

## 2017-12-24 ENCOUNTER — Encounter: Payer: Self-pay | Admitting: Internal Medicine

## 2017-12-24 NOTE — Assessment & Plan Note (Signed)
Asympt, also for a1c with labs

## 2017-12-24 NOTE — Assessment & Plan Note (Signed)

## 2017-12-27 ENCOUNTER — Other Ambulatory Visit (INDEPENDENT_AMBULATORY_CARE_PROVIDER_SITE_OTHER): Payer: Federal, State, Local not specified - PPO

## 2017-12-27 DIAGNOSIS — Z114 Encounter for screening for human immunodeficiency virus [HIV]: Secondary | ICD-10-CM

## 2017-12-27 DIAGNOSIS — Z0000 Encounter for general adult medical examination without abnormal findings: Secondary | ICD-10-CM | POA: Diagnosis not present

## 2017-12-27 DIAGNOSIS — R739 Hyperglycemia, unspecified: Secondary | ICD-10-CM | POA: Diagnosis not present

## 2017-12-27 LAB — CBC WITH DIFFERENTIAL/PLATELET
BASOS ABS: 0.1 10*3/uL (ref 0.0–0.1)
Basophils Relative: 0.8 % (ref 0.0–3.0)
Eosinophils Absolute: 0.2 10*3/uL (ref 0.0–0.7)
Eosinophils Relative: 2.8 % (ref 0.0–5.0)
HCT: 46.4 % (ref 39.0–52.0)
Hemoglobin: 15.7 g/dL (ref 13.0–17.0)
Lymphocytes Relative: 36.1 % (ref 12.0–46.0)
Lymphs Abs: 2.7 10*3/uL (ref 0.7–4.0)
MCHC: 33.8 g/dL (ref 30.0–36.0)
MCV: 95.5 fl (ref 78.0–100.0)
MONO ABS: 0.6 10*3/uL (ref 0.1–1.0)
Monocytes Relative: 7.7 % (ref 3.0–12.0)
NEUTROS PCT: 52.6 % (ref 43.0–77.0)
Neutro Abs: 3.9 10*3/uL (ref 1.4–7.7)
PLATELETS: 277 10*3/uL (ref 150.0–400.0)
RBC: 4.86 Mil/uL (ref 4.22–5.81)
RDW: 13 % (ref 11.5–15.5)
WBC: 7.5 10*3/uL (ref 4.0–10.5)

## 2017-12-27 LAB — BASIC METABOLIC PANEL
BUN: 19 mg/dL (ref 6–23)
CALCIUM: 9.8 mg/dL (ref 8.4–10.5)
CHLORIDE: 104 meq/L (ref 96–112)
CO2: 27 meq/L (ref 19–32)
Creatinine, Ser: 1.2 mg/dL (ref 0.40–1.50)
GFR: 65.47 mL/min (ref >60.00)
GLUCOSE: 90 mg/dL (ref 70–99)
Potassium: 4.8 mEq/L (ref 3.5–5.1)
SODIUM: 141 meq/L (ref 135–145)

## 2017-12-27 LAB — URINALYSIS, ROUTINE W REFLEX MICROSCOPIC
Ketones, ur: NEGATIVE
Leukocytes, UA: NEGATIVE
Nitrite: NEGATIVE
PH: 5.5 (ref 5.0–8.0)
SPECIFIC GRAVITY, URINE: 1.025 (ref 1.000–1.030)
TOTAL PROTEIN, URINE-UPE24: NEGATIVE
URINE GLUCOSE: NEGATIVE
Urobilinogen, UA: 0.2 (ref 0.0–1.0)

## 2017-12-27 LAB — LIPID PANEL
CHOL/HDL RATIO: 4
Cholesterol: 129 mg/dL (ref 0–200)
HDL: 29.9 mg/dL — AB (ref >39.00)
LDL CALC: 79 mg/dL (ref 0–99)
NONHDL: 99.58
Triglycerides: 101 mg/dL (ref 0.0–149.0)
VLDL: 20.2 mg/dL (ref 0.0–40.0)

## 2017-12-27 LAB — PSA: PSA: 1.17 ng/mL (ref 0.10–4.00)

## 2017-12-27 LAB — HEPATIC FUNCTION PANEL
ALT: 39 U/L (ref 0–53)
AST: 26 U/L (ref 0–37)
Albumin: 4.8 g/dL (ref 3.5–5.2)
Alkaline Phosphatase: 74 U/L (ref 39–117)
BILIRUBIN TOTAL: 1.2 mg/dL (ref 0.2–1.2)
Bilirubin, Direct: 0.2 mg/dL (ref 0.0–0.3)
Total Protein: 7.6 g/dL (ref 6.0–8.3)

## 2017-12-27 LAB — TSH: TSH: 2.75 u[IU]/mL (ref 0.35–4.50)

## 2017-12-27 LAB — HEMOGLOBIN A1C: Hgb A1c MFr Bld: 5.7 % (ref 4.6–6.5)

## 2017-12-28 LAB — HIV ANTIBODY (ROUTINE TESTING W REFLEX): HIV: NONREACTIVE

## 2018-01-11 ENCOUNTER — Telehealth (HOSPITAL_COMMUNITY): Payer: Self-pay

## 2018-01-11 NOTE — Telephone Encounter (Signed)
Encounter complete. 

## 2018-01-12 ENCOUNTER — Telehealth (HOSPITAL_COMMUNITY): Payer: Self-pay

## 2018-01-12 NOTE — Telephone Encounter (Signed)
Encounter complete. 

## 2018-01-16 ENCOUNTER — Ambulatory Visit (HOSPITAL_COMMUNITY)
Admission: RE | Admit: 2018-01-16 | Discharge: 2018-01-16 | Disposition: A | Discharge status: Home / Self Care | Payer: Federal, State, Local not specified - PPO | Source: Ambulatory Visit | Attending: Cardiology | Admitting: Cardiology

## 2018-01-16 ENCOUNTER — Encounter (HOSPITAL_COMMUNITY): Payer: Self-pay | Admitting: *Deleted

## 2018-01-16 DIAGNOSIS — R0789 Other chest pain: Secondary | ICD-10-CM | POA: Diagnosis not present

## 2018-01-16 DIAGNOSIS — R9439 Abnormal result of other cardiovascular function study: Secondary | ICD-10-CM | POA: Diagnosis not present

## 2018-01-16 LAB — EXERCISE TOLERANCE TEST
CSEPEDS: 0 s
CSEPHR: 105 %
CSEPPHR: 169 {beats}/min
Estimated workload: 13.4 METS
Exercise duration (min): 12 min
MPHR: 160 {beats}/min
RPE: 17
Rest HR: 69 {beats}/min

## 2018-01-16 NOTE — Progress Notes (Signed)
Dr Stanford Breed reviewed ETT and said pt may leave.

## 2018-01-19 ENCOUNTER — Telehealth: Payer: Self-pay | Admitting: *Deleted

## 2018-01-19 DIAGNOSIS — I517 Cardiomegaly: Secondary | ICD-10-CM

## 2018-01-19 DIAGNOSIS — R0789 Other chest pain: Secondary | ICD-10-CM

## 2018-01-19 NOTE — Telephone Encounter (Signed)
Spoke with pt about his GXT, advised pt Dr Percival Spanish will like for hm to get a lexican myoview done, Order placed and send to scheduler to be schedule

## 2018-01-19 NOTE — Telephone Encounter (Signed)
-----   Message from Minus Breeding, MD sent at 01/18/2018  9:54 PM EST ----- The stress test is non diagnostic.   There are some EKG changes with exercise.  However, it is not clear that this represents ischemia.  He need a [employer].  Please call and schedule this.

## 2018-01-31 ENCOUNTER — Telehealth (HOSPITAL_COMMUNITY): Payer: Self-pay

## 2018-01-31 NOTE — Telephone Encounter (Signed)
Encounter complete. 

## 2018-02-02 ENCOUNTER — Ambulatory Visit (HOSPITAL_COMMUNITY)
Admission: RE | Admit: 2018-02-02 | Discharge: 2018-02-02 | Disposition: A | Discharge status: Home / Self Care | Payer: Federal, State, Local not specified - PPO | Source: Ambulatory Visit | Attending: Cardiology | Admitting: Cardiology

## 2018-02-02 DIAGNOSIS — R0789 Other chest pain: Secondary | ICD-10-CM | POA: Diagnosis not present

## 2018-02-02 LAB — MYOCARDIAL PERFUSION IMAGING
CHL CUP NUCLEAR SDS: 1
CHL CUP NUCLEAR SRS: 0
CHL CUP RESTING HR STRESS: 52 {beats}/min
LV dias vol: 126 mL (ref 62–150)
LV sys vol: 64 mL
Peak HR: 87 {beats}/min
SSS: 1
TID: 1.04

## 2018-02-02 MED ORDER — REGADENOSON 0.4 MG/5ML IV SOLN
0.4000 mg | Freq: Once | INTRAVENOUS | Status: AC
  Administered 2018-02-02: 0.4 mg via INTRAVENOUS

## 2018-02-02 MED ORDER — TECHNETIUM TC 99M TETROFOSMIN IV KIT
10.8000 | PACK | Freq: Once | INTRAVENOUS | Status: AC | PRN
Start: 2018-02-02 — End: 2018-02-02
  Administered 2018-02-02: 10.8 via INTRAVENOUS
  Filled 2018-02-02: qty 11

## 2018-02-02 MED ORDER — TECHNETIUM TC 99M TETROFOSMIN IV KIT
32.3000 | PACK | Freq: Once | INTRAVENOUS | Status: AC | PRN
  Administered 2018-02-02: 32.3 via INTRAVENOUS
  Filled 2018-02-02: qty 33

## 2018-02-13 ENCOUNTER — Ambulatory Visit: Payer: Self-pay | Admitting: *Deleted

## 2018-02-13 NOTE — Telephone Encounter (Signed)
Pt reports vomiting since 0120 this am; 10-12 times. Diarrhea x 2 since this am, "brown watery."  Afebrile, "achy in joints." Denies dizziness. States feels weak. Abdominal cramping "comes and goes" stomach "rolling." Also states intermittent "pain at center of stomach" intermittent, improves with change of position. States 2 co workers with similar symptoms this week. Pt states he has not attempted to drink anything, mouth dry at times, is urinating WNL. Agent had secured appt for tomorrow prior to triage.  Home care advice given. Instructed pt to try Gatorade, sips, slowly increase amount through evening.Instructed not to eat until able to increase fluids, then begin bland diet; crackers, toast.  Call back if symptoms worsen; unable to keep fluids down; reviewed s/s dehydration. Pt would like to keep appt tomorrow.  Reason for Disposition . [1] SEVERE vomiting (e.g., 6 or more times/day, vomits everything) BUT [2] hydrated  Answer Assessment - Initial Assessment Questions 1. VOMITING SEVERITY: "How many times have you vomited in the past 24 hours?"     - MILD:  1 - 2 times/day    - MODERATE: 3 - 5 times/day, decreased oral intake without significant weight loss or symptoms of dehydration    - SEVERE: 6 or more times/day, vomits everything or nearly everything, with significant weight loss, symptoms of dehydration      10-12 times since 0120 this am. 2. ONSET: "When did the vomiting begin?"      0120 this am 3. FLUIDS: "What fluids or food have you vomited up today?" "Have you been able to keep any fluids down?"     Everything; no fluids at all. 4. ABDOMINAL PAIN: "Are your having any abdominal pain?" If yes : "How bad is it and what does it feel like?" (e.g., crampy, dull, intermittent, constant)      "Cramping and rolling"  Pain at center of stomach, constant, positional 5-6/10. 5. DIARRHEA: "Is there any diarrhea?" If so, ask: "How many times today?"      2 episodes diarrhea, brownish  watery. 6. CONTACTS: "Is there anyone else in the family with the same symptoms?"      Two co- workers 7. CAUSE: "What do you think is causing your vomiting?"     Co workers sick 8. HYDRATION STATUS: "Any signs of dehydration?" (e.g., dry mouth [not only dry lips], too weak to stand) "When did you last urinate?"     Mouth dryness comes and goes; Urinating 3 times today, usual color,not dark. 9. OTHER SYMPTOMS: "Do you have any other symptoms?" (e.g., fever, headache, vertigo, vomiting blood or coffee grounds, recent head injury)     Weakness, joints achy  Protocols used: VOMITING-A-AH

## 2018-02-14 ENCOUNTER — Ambulatory Visit: Payer: Federal, State, Local not specified - PPO | Admitting: Internal Medicine

## 2018-02-14 ENCOUNTER — Encounter: Payer: Self-pay | Admitting: Internal Medicine

## 2018-02-14 ENCOUNTER — Other Ambulatory Visit (INDEPENDENT_AMBULATORY_CARE_PROVIDER_SITE_OTHER): Payer: Federal, State, Local not specified - PPO

## 2018-02-14 VITALS — BP 116/84 | HR 65 | Temp 98.2°F | Ht 67.0 in | Wt 168.0 lb

## 2018-02-14 DIAGNOSIS — R112 Nausea with vomiting, unspecified: Secondary | ICD-10-CM | POA: Diagnosis not present

## 2018-02-14 DIAGNOSIS — K529 Noninfective gastroenteritis and colitis, unspecified: Secondary | ICD-10-CM | POA: Insufficient documentation

## 2018-02-14 DIAGNOSIS — R109 Unspecified abdominal pain: Secondary | ICD-10-CM | POA: Diagnosis not present

## 2018-02-14 DIAGNOSIS — R739 Hyperglycemia, unspecified: Secondary | ICD-10-CM | POA: Diagnosis not present

## 2018-02-14 LAB — HEPATIC FUNCTION PANEL
ALK PHOS: 70 U/L (ref 39–117)
ALT: 30 U/L (ref 0–53)
AST: 22 U/L (ref 0–37)
Albumin: 4.4 g/dL (ref 3.5–5.2)
BILIRUBIN DIRECT: 0.2 mg/dL (ref 0.0–0.3)
BILIRUBIN TOTAL: 1.1 mg/dL (ref 0.2–1.2)
Total Protein: 7.6 g/dL (ref 6.0–8.3)

## 2018-02-14 LAB — CBC WITH DIFFERENTIAL/PLATELET
BASOS PCT: 0.7 % (ref 0.0–3.0)
Basophils Absolute: 0.1 10*3/uL (ref 0.0–0.1)
EOS PCT: 3.8 % (ref 0.0–5.0)
Eosinophils Absolute: 0.3 10*3/uL (ref 0.0–0.7)
HCT: 46 % (ref 39.0–52.0)
HEMOGLOBIN: 15.6 g/dL (ref 13.0–17.0)
LYMPHS ABS: 2 10*3/uL (ref 0.7–4.0)
Lymphocytes Relative: 29.4 % (ref 12.0–46.0)
MCHC: 33.9 g/dL (ref 30.0–36.0)
MCV: 95.1 fl (ref 78.0–100.0)
MONO ABS: 0.8 10*3/uL (ref 0.1–1.0)
Monocytes Relative: 11.4 % (ref 3.0–12.0)
NEUTROS ABS: 3.7 10*3/uL (ref 1.4–7.7)
Neutrophils Relative %: 54.7 % (ref 43.0–77.0)
PLATELETS: 253 10*3/uL (ref 150.0–400.0)
RBC: 4.84 Mil/uL (ref 4.22–5.81)
RDW: 12.3 % (ref 11.5–15.5)
WBC: 6.7 10*3/uL (ref 4.0–10.5)

## 2018-02-14 LAB — BASIC METABOLIC PANEL
BUN: 17 mg/dL (ref 6–23)
CHLORIDE: 104 meq/L (ref 96–112)
CO2: 29 meq/L (ref 19–32)
CREATININE: 1.08 mg/dL (ref 0.40–1.50)
Calcium: 9.4 mg/dL (ref 8.4–10.5)
GFR: 73.9 mL/min (ref >60.00)
GLUCOSE: 97 mg/dL (ref 70–99)
POTASSIUM: 4.1 meq/L (ref 3.5–5.1)
Sodium: 139 mEq/L (ref 135–145)

## 2018-02-14 LAB — LIPASE: Lipase: 18 U/L (ref 11.0–59.0)

## 2018-02-14 MED ORDER — DIPHENOXYLATE-ATROPINE 2.5-0.025 MG PO TABS: 1.0000 | ORAL_TABLET | Freq: Four times a day (QID) | ORAL | 1 refills | Status: DC | PRN

## 2018-02-14 MED ORDER — ONDANSETRON HCL 4 MG PO TABS: 4.0000 mg | ORAL_TABLET | Freq: Three times a day (TID) | ORAL | 1 refills | Status: DC | PRN

## 2018-02-14 MED ORDER — ONDANSETRON HCL 4 MG/2ML IJ SOLN
4.0000 mg | Freq: Three times a day (TID) | INTRAMUSCULAR | Status: DC | PRN
  Administered 2018-02-14: 4 mg via INTRAMUSCULAR

## 2018-02-14 NOTE — Patient Instructions (Signed)
You had the nausea shot today (zofran)  Please take all new medication as prescribed - the nausea pills as needed, and the Lomotil as needed  Please focus on as much fluids as you can keep down, such as gatorade and water  Please continue all other medications as before, and refills have been done if requested.  Please have the pharmacy call with any other refills you may need.  Please keep your appointments with your specialists as you may have planned  Please go to the LAB in the Basement (turn left off the elevator) for the tests to be done today  You will be contacted by phone if any changes need to be made immediately.  Otherwise, you will receive a letter about your results with an explanation, but please check with MyChart first.  Please remember to sign up for MyChart if you have not done so, as this will be important to you in the future with finding out test results, communicating by private email, and scheduling acute appointments online when needed.  You are given the work note today

## 2018-02-14 NOTE — Progress Notes (Signed)
Subjective:    Patient ID: Martin Walker, male    DOB: [birth year], 62 y.o.   MRN: [id number]  HPI  Here to f/u with c/o 2 days acute onset moderate n/v/d with watery nonbloody stools, just cant seem to stay out of the BR, assoc with feverish, aching all over and chills, diffuse abd pain, and today dizziness and more yellow urine.  Denies worsening reflux, dysphagia, or blood.  Pt denies chest pain, increased sob or doe, wheezing, orthopnea, PND, increased LE swelling, palpitations, or syncope.  Pt denies new neurological symptoms such as new headache, or facial or extremity weakness or numbness   Pt denies polydipsia, polyuria Past Medical History:  Diagnosis Date  . Asthma    chilhood  . Rhinitis, allergic    skin test pos 11/26/08- dust,cat  . Rhinosinusitis    recurrent   Past Surgical History:  Procedure Laterality Date  . COLONOSCOPY    . left knee    . right wrist    . ROTATOR CUFF REPAIR     left    reports that he quit smoking about 6 years ago. His smoking use included cigarettes. He has a 52.50 pack-year smoking history. He has quit using smokeless tobacco. He reports that he drinks alcohol. He reports that he uses drugs. Drug: Marijuana. family history includes Colon cancer in his unknown relative; Heart disease in his father; Hypertension in his mother; Lung cancer in his unknown relative; Other in his unknown relative; Thyroid disease in his mother. Allergies  Allergen Reactions  . Codeine     Causes peeling of the hands   Current Outpatient Medications on File Prior to Visit  Medication Sig Dispense Refill  . ALPRAZolam (XANAX) 0.25 MG tablet 1 every 6 hrs as needed for heart racing 30 tablet 2  . ALREX 0.2 % SUSP As directed    . aspirin EC 81 MG tablet Take 1 tablet (81 mg total) by mouth daily. 90 tablet 11  . atorvastatin (LIPITOR) 20 MG tablet Take 1 tablet (20 mg total) by mouth daily. Yearly physical is due must see MD for future refills 90 tablet 3  .  cyclobenzaprine (FLEXERIL) 5 MG tablet Take 1 tablet (5 mg total) by mouth 3 (three) times daily as needed. for muscle spams 60 tablet 2  . fluticasone (FLONASE) 50 MCG/ACT nasal spray USE TWO SPRAY(S) IN EACH NOSTRIL ONCE DAILY 16 g 11  . meloxicam (MOBIC) 15 MG tablet Take 1 tablet (15 mg total) by mouth daily. 30 tablet 0  . metoprolol tartrate (LOPRESSOR) 25 MG tablet Take 1 tablet (25 mg total) by mouth 2 (two) times daily. 180 tablet 3  . valACYclovir (VALTREX) 1000 MG tablet Take 1 tablet (1,000 mg total) by mouth 3 (three) times daily. As needed for cold sore episodes 21 tablet 3   No current facility-administered medications on file prior to visit.    Review of Systems  Constitutional: Negative for other unusual diaphoresis or sweats HENT: Negative for ear discharge or swelling Eyes: Negative for other worsening visual disturbances Respiratory: Negative for stridor or other swelling  Gastrointestinal: Negative for worsening distension or other blood Genitourinary: Negative for retention or other urinary change Musculoskeletal: Negative for other MSK pain or swelling Skin: Negative for color change or other new lesions Neurological: Negative for worsening tremors and other numbness  Psychiatric/Behavioral: Negative for worsening agitation or other fatigue All other system neg per pt    Objective:   Physical Exam BP 116/84  Pulse 65   Temp 98.2 F (36.8 C) (Oral)   Ht 5\' 7"  (1.702 m)   Wt 168 lb (76.2 kg)   SpO2 96%   BMI 26.31 kg/m  VS noted, mild ill Constitutional: Pt appears in NAD HENT: Head: NCAT.  Right Ear: External ear normal.  Left Ear: External ear normal.  Eyes: . Pupils are equal, round, and reactive to light. Conjunctivae and EOM are normal Bilat tm's with mild erythema.  Max sinus areas non tender.  Pharynx with mild erythema, no exudate Nose: without d/c or deformity Neck: Neck supple. Gross normal ROM Cardiovascular: Normal rate and regular rhythm.     Pulmonary/Chest: Effort normal and breath sounds without rales or wheezing.  Abd:  Soft, ND, + BS, no organomegaly with diffuse mild pain Neurological: Pt is alert. At baseline orientation, motor grossly intact Skin: Skin is warm. No rashes, other new lesions, no LE edema Psychiatric: Pt behavior is normal without agitation  No other exam findings    Assessment & Plan:

## 2018-02-15 ENCOUNTER — Other Ambulatory Visit: Payer: Federal, State, Local not specified - PPO

## 2018-02-15 DIAGNOSIS — K529 Noninfective gastroenteritis and colitis, unspecified: Secondary | ICD-10-CM | POA: Diagnosis not present

## 2018-02-15 DIAGNOSIS — R109 Unspecified abdominal pain: Secondary | ICD-10-CM | POA: Diagnosis not present

## 2018-02-15 DIAGNOSIS — R112 Nausea with vomiting, unspecified: Secondary | ICD-10-CM

## 2018-02-17 NOTE — Assessment & Plan Note (Signed)
Mild to mod, for zofran IM 4 mg, zofran po prn, lomotil prn, for labs today,  to f/u any worsening symptoms or concerns

## 2018-02-17 NOTE — Assessment & Plan Note (Signed)
Likely due to above, for labs as above

## 2018-02-17 NOTE — Assessment & Plan Note (Signed)
Lab Results  Component Value Date   HGBA1C 5.7 12/27/2017  stable overall by history and exam, recent data reviewed with pt, and pt to continue medical treatment as before,  to f/u any worsening symptoms or concerns, pt to call for cbg > 200

## 2018-02-19 LAB — GASTROINTESTINAL PATHOGEN PANEL PCR
C. difficile Tox A/B, PCR: NOT DETECTED
CRYPTOSPORIDIUM, PCR: NOT DETECTED
Campylobacter, PCR: NOT DETECTED
E COLI (STEC) STX1/STX2, PCR: NOT DETECTED
E coli (ETEC) LT/ST PCR: NOT DETECTED
E coli 0157, PCR: NOT DETECTED
Giardia lamblia, PCR: NOT DETECTED
NOROVIRUS, PCR: NOT DETECTED
ROTAVIRUS, PCR: NOT DETECTED
SALMONELLA, PCR: NOT DETECTED
SHIGELLA, PCR: NOT DETECTED

## 2018-02-20 ENCOUNTER — Encounter: Payer: Self-pay | Admitting: Internal Medicine

## 2018-05-04 DIAGNOSIS — H9072 Mixed conductive and sensorineural hearing loss, unilateral, left ear, with unrestricted hearing on the contralateral side: Secondary | ICD-10-CM | POA: Diagnosis not present

## 2018-05-25 DIAGNOSIS — H90A32 Mixed conductive and sensorineural hearing loss, unilateral, left ear with restricted hearing on the contralateral side: Secondary | ICD-10-CM | POA: Diagnosis not present

## 2018-07-17 DIAGNOSIS — H9072 Mixed conductive and sensorineural hearing loss, unilateral, left ear, with unrestricted hearing on the contralateral side: Secondary | ICD-10-CM | POA: Diagnosis not present

## 2018-09-23 ENCOUNTER — Other Ambulatory Visit: Payer: Self-pay | Admitting: Internal Medicine

## 2018-09-24 NOTE — Telephone Encounter (Signed)
Done erx 

## 2018-10-01 ENCOUNTER — Encounter: Payer: Self-pay | Admitting: Internal Medicine

## 2018-10-01 ENCOUNTER — Ambulatory Visit: Payer: Federal, State, Local not specified - PPO | Admitting: Internal Medicine

## 2018-10-01 ENCOUNTER — Ambulatory Visit: Payer: Self-pay

## 2018-10-01 VITALS — BP 142/88 | HR 66 | Temp 97.8°F | Ht 67.0 in | Wt 172.0 lb

## 2018-10-01 DIAGNOSIS — R739 Hyperglycemia, unspecified: Secondary | ICD-10-CM | POA: Diagnosis not present

## 2018-10-01 DIAGNOSIS — E785 Hyperlipidemia, unspecified: Secondary | ICD-10-CM | POA: Diagnosis not present

## 2018-10-01 DIAGNOSIS — I10 Essential (primary) hypertension: Secondary | ICD-10-CM | POA: Diagnosis not present

## 2018-10-01 MED ORDER — HYDROCHLOROTHIAZIDE 25 MG PO TABS: 25.0000 mg | ORAL_TABLET | Freq: Every day | ORAL | 3 refills | Status: DC

## 2018-10-01 MED ORDER — METOPROLOL TARTRATE 50 MG PO TABS: 50.0000 mg | ORAL_TABLET | Freq: Two times a day (BID) | ORAL | 3 refills | Status: DC

## 2018-10-01 NOTE — Assessment & Plan Note (Signed)
stable overall by history and exam, recent data reviewed with pt, and pt to continue medical treatment as before,  to f/u any worsening symptoms or concerns  

## 2018-10-01 NOTE — Progress Notes (Signed)
Subjective:    Patient ID: Martin Walker, male    DOB: February 20, 1956, 62 y.o.   MRN: [id number]  HPI Here to f/u; overall doing ok,  Pt denies chest pain, increasing sob or doe, wheezing, orthopnea, PND, increased LE swelling, palpitations, dizziness or syncope.  Pt denies new neurological symptoms such as new headache, or facial or extremity weakness or numbness.  Pt denies polydipsia, polyuria, or low sugar episode.  Pt states overall good compliance with meds, mostly trying to follow appropriate diet, with wt overall stable,  And remains active with much walking at [employer] position, plans to retire in about 6 mo Past Medical History:  Diagnosis Date  . Asthma    chilhood  . Rhinitis, allergic    skin test pos 11/26/08- dust,cat  . Rhinosinusitis    recurrent   Past Surgical History:  Procedure Laterality Date  . COLONOSCOPY    . left knee    . right wrist    . ROTATOR CUFF REPAIR     left    reports that he quit smoking about 7 years ago. His smoking use included cigarettes. He has a 52.50 pack-year smoking history. He has quit using smokeless tobacco. He reports that he drinks alcohol. He reports that he has current or past drug history. Drug: Marijuana. family history includes Colon cancer in his unknown relative; Heart disease in his father; Hypertension in his mother; Lung cancer in his unknown relative; Other in his unknown relative; Thyroid disease in his mother. Allergies  Allergen Reactions  . Codeine     Causes peeling of the hands   Current Outpatient Medications on File Prior to Visit  Medication Sig Dispense Refill  . ALPRAZolam (XANAX) 0.25 MG tablet TAKE 1 TABLET BY MOUTH EVERY 6 HOURS AS NEEDED FOR  HEART  RACING 30 tablet 2  . ALREX 0.2 % SUSP As directed    . aspirin EC 81 MG tablet Take 1 tablet (81 mg total) by mouth daily. 90 tablet 11  . atorvastatin (LIPITOR) 20 MG tablet Take 1 tablet (20 mg total) by mouth daily. Yearly physical is due must see MD  for future refills 90 tablet 3  . cyclobenzaprine (FLEXERIL) 5 MG tablet Take 1 tablet (5 mg total) by mouth 3 (three) times daily as needed. for muscle spams 60 tablet 2  . diphenoxylate-atropine (LOMOTIL) 2.5-0.025 MG tablet Take 1 tablet by mouth 4 (four) times daily as needed for diarrhea or loose stools. 30 tablet 1  . fluticasone (FLONASE) 50 MCG/ACT nasal spray USE TWO SPRAY(S) IN EACH NOSTRIL ONCE DAILY 16 g 11  . meloxicam (MOBIC) 15 MG tablet Take 1 tablet (15 mg total) by mouth daily. 30 tablet 0  . ondansetron (ZOFRAN) 4 MG tablet Take 1 tablet (4 mg total) by mouth every 8 (eight) hours as needed for nausea or vomiting. 30 tablet 1  . valACYclovir (VALTREX) 1000 MG tablet Take 1 tablet (1,000 mg total) by mouth 3 (three) times daily. As needed for cold sore episodes 21 tablet 3   Current Facility-Administered Medications on File Prior to Visit  Medication Dose Route Frequency Provider Last Rate Last Dose  . ondansetron (ZOFRAN) injection 4 mg  4 mg Intramuscular Q8H PRN Biagio Borg, MD   4 mg at 02/14/18 1137    Review of Systems  Constitutional: Negative for other unusual diaphoresis or sweats HENT: Negative for ear discharge or swelling Eyes: Negative for other worsening visual disturbances Respiratory: Negative for stridor  or other swelling  Gastrointestinal: Negative for worsening distension or other blood Genitourinary: Negative for retention or other urinary change Musculoskeletal: Negative for other MSK pain or swelling Skin: Negative for color change or other new lesions Neurological: Negative for worsening tremors and other numbness  Psychiatric/Behavioral: Negative for worsening agitation or other fatigue All other system neg per pt    Objective:   Physical Exam BP (!) 142/88   Pulse 66   Temp 97.8 F (36.6 C) (Oral)   Ht 5\' 7"  (1.702 m)   Wt 172 lb (78 kg)   SpO2 95%   BMI 26.94 kg/m  VS noted,  Constitutional: Pt appears in NAD HENT: Head: NCAT.    Right Ear: External ear normal.  Left Ear: External ear normal.  Eyes: . Pupils are equal, round, and reactive to light. Conjunctivae and EOM are normal Nose: without d/c or deformity Neck: Neck supple. Gross normal ROM Cardiovascular: Normal rate and regular rhythm.   Pulmonary/Chest: Effort normal and breath sounds without rales or wheezing.  Neurological: Pt is alert. At baseline orientation, motor grossly intact Skin: Skin is warm. No rashes, other new lesions, no LE edema Psychiatric: Pt behavior is normal without agitation  No other exam findings  Lab Results  Component Value Date   WBC 6.7 02/14/2018   HGB 15.6 02/14/2018   HCT 46.0 02/14/2018   PLT 253.0 02/14/2018   GLUCOSE 97 02/14/2018   CHOL 129 12/27/2017   TRIG 101.0 12/27/2017   HDL 29.90 (L) 12/27/2017   LDLDIRECT 167.4 12/11/2013   LDLCALC 79 12/27/2017   ALT 30 02/14/2018   AST 22 02/14/2018   NA 139 02/14/2018   K 4.1 02/14/2018   CL 104 02/14/2018   CREATININE 1.08 02/14/2018   BUN 17 02/14/2018   CO2 29 02/14/2018   TSH 2.75 12/27/2017   PSA 1.17 12/27/2017   HGBA1C 5.7 12/27/2017   MICROALBUR 1.0 12/14/2016        Assessment & Plan:

## 2018-10-01 NOTE — Patient Instructions (Addendum)
Please take all new medication as prescribed - the fluid pill for blood pressure  OK to increase the metoprolol to 50 mg twice per day  Please continue to monitor your blood pressure at home, with the goal being to be less than 130/80  Please continue all other medications as before, and refills have been done if requested.  Please have the pharmacy call with any other refills you may need.  Please continue your efforts at being more active, low cholesterol diet, and weight control.  Please keep your appointments with your specialists as you may have planned  Enjoy your retirement in 6 months!

## 2018-10-01 NOTE — Telephone Encounter (Signed)
Pt. Reports he checked his BP Saturday and it was 160/100. States his provider told him he could increase his metoprolol . He has been taking 2 25 mg tablets twice a day since Saturday. BP this morning 147/93. Denies any headache or blurred vision this morning. States he has increased stress at home and at work. Appointment made for today.  Reason for Disposition . Systolic BP  >= 469 OR Diastolic >= 629  Answer Assessment - Initial Assessment Questions 1. BLOOD PRESSURE: "What is the blood pressure?" "Did you take at least two measurements 5 minutes apart?"     Took BP Sat. 160/100  This morning 147/93 2. ONSET: "When did you take your blood pressure?"     This morning 3. HOW: "How did you obtain the blood pressure?" (e.g., visiting nurse, automatic home BP monitor)     Home BP monitor 4. HISTORY: "Do you have a history of high blood pressure?"     Yes 5. MEDICATIONS: "Are you taking any medications for blood pressure?" "Have you missed any doses recently?"     Yes 6. OTHER SYMPTOMS: "Do you have any symptoms?" (e.g., headache, chest pain, blurred vision, difficulty breathing, weakness)     No 7. PREGNANCY: "Is there any chance you are pregnant?" "When was your last menstrual period?"      N/a  Protocols used: HIGH BLOOD PRESSURE-A-AH

## 2018-10-01 NOTE — Assessment & Plan Note (Signed)
Uncontrolled, to increase the metoprolol to 50 bid, and add hct 25 qd, cont to follow BP at home with goal < 130/90

## 2018-10-11 ENCOUNTER — Ambulatory Visit (INDEPENDENT_AMBULATORY_CARE_PROVIDER_SITE_OTHER): Payer: Federal, State, Local not specified - PPO | Admitting: *Deleted

## 2018-10-11 ENCOUNTER — Encounter: Payer: Self-pay | Admitting: *Deleted

## 2018-10-11 DIAGNOSIS — Z23 Encounter for immunization: Secondary | ICD-10-CM | POA: Diagnosis not present

## 2018-11-06 DIAGNOSIS — C44619 Basal cell carcinoma of skin of left upper limb, including shoulder: Secondary | ICD-10-CM | POA: Diagnosis not present

## 2018-11-06 DIAGNOSIS — L570 Actinic keratosis: Secondary | ICD-10-CM | POA: Diagnosis not present

## 2018-11-06 DIAGNOSIS — C44319 Basal cell carcinoma of skin of other parts of face: Secondary | ICD-10-CM | POA: Diagnosis not present

## 2018-11-06 DIAGNOSIS — L821 Other seborrheic keratosis: Secondary | ICD-10-CM | POA: Diagnosis not present

## 2018-11-06 DIAGNOSIS — L720 Epidermal cyst: Secondary | ICD-10-CM | POA: Diagnosis not present

## 2018-11-06 DIAGNOSIS — D225 Melanocytic nevi of trunk: Secondary | ICD-10-CM | POA: Diagnosis not present

## 2018-12-04 DIAGNOSIS — C44619 Basal cell carcinoma of skin of left upper limb, including shoulder: Secondary | ICD-10-CM | POA: Diagnosis not present

## 2018-12-04 DIAGNOSIS — L7682 Other postprocedural complications of skin and subcutaneous tissue: Secondary | ICD-10-CM | POA: Diagnosis not present

## 2018-12-09 NOTE — Progress Notes (Signed)
HPI The patient presents for evaluation of palpitations.  He had some chest pain at the last visit.  I sent him for a POET (Plain Old Exercise Treadmill) which was non diagnostic.  Perfusion study was negative for ischemia.  Since I saw him he has been doing well.  He plans on retiring about 3 months and he is much more relaxed since then.  He is not had any chest discomfort, neck or arm discomfort.  He is had no new shortness of breath, PND or orthopnea.  He has rare palpitations, but no presyncope or syncope.   Allergies  Allergen Reactions  . Codeine     Causes peeling of the hands    Current Outpatient Medications  Medication Sig Dispense Refill  . ALPRAZolam (XANAX) 0.25 MG tablet TAKE 1 TABLET BY MOUTH EVERY 6 HOURS AS NEEDED FOR  HEART  RACING 30 tablet 2  . ALREX 0.2 % SUSP As directed    . aspirin EC 81 MG tablet Take 1 tablet (81 mg total) by mouth daily. 90 tablet 11  . atorvastatin (LIPITOR) 20 MG tablet Take 1 tablet (20 mg total) by mouth daily. Yearly physical is due must see MD for future refills 90 tablet 3  . cyclobenzaprine (FLEXERIL) 5 MG tablet Take 1 tablet (5 mg total) by mouth 3 (three) times daily as needed. for muscle spams 60 tablet 2  . diphenoxylate-atropine (LOMOTIL) 2.5-0.025 MG tablet Take 1 tablet by mouth 4 (four) times daily as needed for diarrhea or loose stools. 30 tablet 1  . fluticasone (FLONASE) 50 MCG/ACT nasal spray USE TWO SPRAY(S) IN EACH NOSTRIL ONCE DAILY 16 g 11  . hydrochlorothiazide (HYDRODIURIL) 25 MG tablet Take 1 tablet (25 mg total) by mouth daily. 90 tablet 3  . meloxicam (MOBIC) 15 MG tablet Take 1 tablet (15 mg total) by mouth daily. 30 tablet 0  . metoprolol tartrate (LOPRESSOR) 50 MG tablet Take 1 tablet (50 mg total) by mouth 2 (two) times daily. 180 tablet 3  . ondansetron (ZOFRAN) 4 MG tablet Take 1 tablet (4 mg total) by mouth every 8 (eight) hours as needed for nausea or vomiting. 30 tablet 1  . valACYclovir (VALTREX) 1000  MG tablet Take 1 tablet (1,000 mg total) by mouth 3 (three) times daily. As needed for cold sore episodes 21 tablet 3   Current Facility-Administered Medications  Medication Dose Route Frequency Provider Last Rate Last Dose  . ondansetron (ZOFRAN) injection 4 mg  4 mg Intramuscular Q8H PRN Biagio Borg, MD   4 mg at 02/14/18 1137    Past Medical History:  Diagnosis Date  . Asthma    chilhood  . Rhinitis, allergic    skin test pos 11/26/08- dust,cat  . Rhinosinusitis    recurrent    Past Surgical History:  Procedure Laterality Date  . COLONOSCOPY    . left knee    . right wrist    . ROTATOR CUFF REPAIR     left    ROS:    As stated in the HPI and negative for all other systems.   PHYSICAL EXAM BP (!) 142/88   Pulse 60   Ht 5\' 7"  (1.702 m)   Wt 176 lb 9.6 oz (80.1 kg)   BMI 27.66 kg/m   GENERAL:  Well appearing NECK:  No jugular venous distention, waveform within normal limits, carotid upstroke brisk and symmetric, no bruits, no thyromegaly LUNGS:  Clear to auscultation bilaterally CHEST:  Unremarkable  HEART:  PMI not displaced or sustained,S1 and S2 within normal limits, no S3, no S4, no clicks, no rubs, no murmurs ABD:  Flat, positive bowel sounds normal in frequency in pitch, no bruits, no rebound, no guarding, no midline pulsatile mass, no hepatomegaly, no splenomegaly EXT:  2 plus pulses throughout, no edema, no cyanosis no clubbing   EKG:  Sinus rhythm, rate 60, axis within normal limits, intervals within normal limits, no acute ST-T wave changes with PACs.   12/16/14  Lab Results  Component Value Date   CHOL 129 12/27/2017   TRIG 101.0 12/27/2017   HDL 29.90 (L) 12/27/2017   LDLCALC 79 12/27/2017   LDLDIRECT 167.4 12/11/2013    ASSESSMENT AND PLAN  Palpitations -  These are not particularly problematic.  No change in therapy.  HTN - Blood pressure is very mildly elevated today but at home it is in the 130s over 70s to 80s and no change in therapy is  indicated.  TOBACCO USE DISORDER -  He is still not smoking.  Dyslipidemia - I have reviewed his lipids. His LDL was 79.  No change in therapy.  Chest Pain - He had a negative perfusion study.  He had no symptoms since that time.  No change in therapy.

## 2018-12-10 ENCOUNTER — Encounter: Payer: Self-pay | Admitting: Cardiology

## 2018-12-10 ENCOUNTER — Ambulatory Visit: Payer: Federal, State, Local not specified - PPO | Admitting: Cardiology

## 2018-12-10 VITALS — BP 142/88 | HR 60 | Ht 67.0 in | Wt 176.6 lb

## 2018-12-10 DIAGNOSIS — R002 Palpitations: Secondary | ICD-10-CM | POA: Diagnosis not present

## 2018-12-10 DIAGNOSIS — R079 Chest pain, unspecified: Secondary | ICD-10-CM | POA: Diagnosis not present

## 2018-12-10 DIAGNOSIS — E785 Hyperlipidemia, unspecified: Secondary | ICD-10-CM

## 2018-12-10 DIAGNOSIS — I10 Essential (primary) hypertension: Secondary | ICD-10-CM | POA: Diagnosis not present

## 2018-12-10 NOTE — Patient Instructions (Signed)
Medication Instructions:  Continue current medications  If you need a refill on your cardiac medications before your next appointment, please call your pharmacy.  Labwork: None Ordered   If you have labs (blood work) drawn today and your tests are completely normal, you will receive your results only by [employer] (if you have MyChart) -OR- A paper copy in the mail.  If you have any lab test that is abnormal or we need to change your treatment, we will call you to review these results.  Testing/Procedures: None Ordered  Follow-Up: You will need a follow up appointment in 12 months.  Please call our office 2 months in advance to schedule this appointment.  You may see Dr Percival Spanish or one of the following Advanced Practice Providers on your designated Care Team:   Rosaria Ferries, PA-C . Jory Sims, DNP, ANP    At [facility], you and your health needs are our priority.  As part of our continuing mission to provide you with exceptional heart care, we have created designated Provider Care Teams.  These Care Teams include your primary Cardiologist (physician) and Advanced Practice Providers (APPs -  Physician Assistants and Nurse Practitioners) who all work together to provide you with the care you need, when you need it.

## 2018-12-11 DIAGNOSIS — Z4802 Encounter for removal of sutures: Secondary | ICD-10-CM | POA: Diagnosis not present

## 2018-12-27 ENCOUNTER — Ambulatory Visit (INDEPENDENT_AMBULATORY_CARE_PROVIDER_SITE_OTHER): Payer: Federal, State, Local not specified - PPO | Admitting: Internal Medicine

## 2018-12-27 ENCOUNTER — Encounter: Payer: Self-pay | Admitting: Internal Medicine

## 2018-12-27 VITALS — BP 144/90 | HR 63 | Temp 98.0°F | Ht 67.0 in | Wt 178.0 lb

## 2018-12-27 DIAGNOSIS — R739 Hyperglycemia, unspecified: Secondary | ICD-10-CM | POA: Diagnosis not present

## 2018-12-27 DIAGNOSIS — Z0000 Encounter for general adult medical examination without abnormal findings: Secondary | ICD-10-CM

## 2018-12-27 NOTE — Assessment & Plan Note (Signed)
stable overall by history and exam, recent data reviewed with pt, and pt to continue medical treatment as before,  to f/u any worsening symptoms or concerns  

## 2018-12-27 NOTE — Progress Notes (Signed)
Subjective:    Patient ID: Martin Walker, male    DOB: 05/01/1956, 63 y.o.   MRN: [id number]  HPI   Here for wellness and f/u;  Overall doing ok;  Pt denies Chest pain, worsening SOB, DOE, wheezing, orthopnea, PND, worsening LE edema, palpitations, dizziness or syncope.  Pt denies neurological change such as new headache, facial or extremity weakness.  Pt denies polydipsia, polyuria, or low sugar symptoms. Pt states overall good compliance with treatment and medications, good tolerability, and has been trying to follow appropriate diet.  Pt denies worsening depressive symptoms, suicidal ideation or panic. No fever, night sweats, wt loss, loss of appetite, or other constitutional symptoms.  Pt states good ability with ADL's, has low fall risk, home safety reviewed and adequate, no other significant changes in hearing or vision, and only occasionally active with exercise. BP at home < 140/90 per pt BP Readings from Last 3 Encounters:  12/27/18 (!) 144/90  12/10/18 (!) 142/88  10/01/18 (!) 142/88  Plans to retire in 2 month, plans to be much more active after.  No new complaints Past Medical History:  Diagnosis Date  . Asthma    chilhood  . Rhinitis, allergic    skin test pos 11/26/08- dust,cat  . Rhinosinusitis    recurrent   Past Surgical History:  Procedure Laterality Date  . COLONOSCOPY    . left knee    . right wrist    . ROTATOR CUFF REPAIR     left    reports that he quit smoking about 7 years ago. His smoking use included cigarettes. He has a 52.50 pack-year smoking history. He has quit using smokeless tobacco. He reports current alcohol use. He reports current drug use. Drug: Marijuana. family history includes Colon cancer in his unknown relative; Heart disease in his father; Hypertension in his mother; Lung cancer in his unknown relative; Other in his unknown relative; Thyroid disease in his mother. Allergies  Allergen Reactions  . Codeine     Causes peeling of the  hands   Current Outpatient Medications on File Prior to Visit  Medication Sig Dispense Refill  . ALPRAZolam (XANAX) 0.25 MG tablet TAKE 1 TABLET BY MOUTH EVERY 6 HOURS AS NEEDED FOR  HEART  RACING 30 tablet 2  . ALREX 0.2 % SUSP As directed    . aspirin EC 81 MG tablet Take 1 tablet (81 mg total) by mouth daily. 90 tablet 11  . atorvastatin (LIPITOR) 20 MG tablet Take 1 tablet (20 mg total) by mouth daily. Yearly physical is due must see MD for future refills 90 tablet 3  . cyclobenzaprine (FLEXERIL) 5 MG tablet Take 1 tablet (5 mg total) by mouth 3 (three) times daily as needed. for muscle spams 60 tablet 2  . diphenoxylate-atropine (LOMOTIL) 2.5-0.025 MG tablet Take 1 tablet by mouth 4 (four) times daily as needed for diarrhea or loose stools. 30 tablet 1  . fluticasone (FLONASE) 50 MCG/ACT nasal spray USE TWO SPRAY(S) IN EACH NOSTRIL ONCE DAILY 16 g 11  . hydrochlorothiazide (HYDRODIURIL) 25 MG tablet Take 1 tablet (25 mg total) by mouth daily. 90 tablet 3  . meloxicam (MOBIC) 15 MG tablet Take 1 tablet (15 mg total) by mouth daily. 30 tablet 0  . metoprolol tartrate (LOPRESSOR) 50 MG tablet Take 1 tablet (50 mg total) by mouth 2 (two) times daily. 180 tablet 3  . ondansetron (ZOFRAN) 4 MG tablet Take 1 tablet (4 mg total) by mouth every 8 (eight)  hours as needed for nausea or vomiting. 30 tablet 1  . valACYclovir (VALTREX) 1000 MG tablet Take 1 tablet (1,000 mg total) by mouth 3 (three) times daily. As needed for cold sore episodes 21 tablet 3   Current Facility-Administered Medications on File Prior to Visit  Medication Dose Route Frequency Provider Last Rate Last Dose  . ondansetron (ZOFRAN) injection 4 mg  4 mg Intramuscular Q8H PRN Biagio Borg, MD   4 mg at 02/14/18 1137   Review of Systems Constitutional: Negative for other unusual diaphoresis, sweats, appetite or weight changes HENT: Negative for other worsening hearing loss, ear pain, facial swelling, mouth sores or neck  stiffness.   Eyes: Negative for other worsening pain, redness or other visual disturbance.  Respiratory: Negative for other stridor or swelling Cardiovascular: Negative for other palpitations or other chest pain  Gastrointestinal: Negative for worsening diarrhea or loose stools, blood in stool, distention or other pain Genitourinary: Negative for hematuria, flank pain or other change in urine volume.  Musculoskeletal: Negative for myalgias or other joint swelling.  Skin: Negative for other color change, or other wound or worsening drainage.  Neurological: Negative for other syncope or numbness. Hematological: Negative for other adenopathy or swelling Psychiatric/Behavioral: Negative for hallucinations, other worsening agitation, SI, self-injury, or new decreased concentration All other system neg per pt    Objective:   Physical Exam BP (!) 144/90   Pulse 63   Temp 98 F (36.7 C) (Oral)   Ht 5\' 7"  (1.702 m)   Wt 178 lb (80.7 kg)   SpO2 95%   BMI 27.88 kg/m  VS noted,  Constitutional: Pt is oriented to person, place, and time. Appears well-developed and well-nourished, in no significant distress and comfortable Head: Normocephalic and atraumatic  Eyes: Conjunctivae and EOM are normal. Pupils are equal, round, and reactive to light Right Ear: External ear normal without discharge Left Ear: External ear normal without discharge Nose: Nose without discharge or deformity Mouth/Throat: Oropharynx is without other ulcerations and moist  Neck: Normal range of motion. Neck supple. No JVD present. No tracheal deviation present or significant neck LA or mass Cardiovascular: Normal rate, regular rhythm, normal heart sounds and intact distal pulses.   Pulmonary/Chest: WOB normal and breath sounds without rales or wheezing  Abdominal: Soft. Bowel sounds are normal. NT. No HSM  Musculoskeletal: Normal range of motion. Exhibits no edema Lymphadenopathy: Has no other cervical adenopathy.    Neurological: Pt is alert and oriented to person, place, and time. Pt has normal reflexes. No cranial nerve deficit. Motor grossly intact, Gait intact Skin: Skin is warm and dry. No rash noted or new ulcerations Psychiatric:  Has normal mood and affect. Behavior is normal without agitation No other exam findings Lab Results  Component Value Date   WBC 6.7 02/14/2018   HGB 15.6 02/14/2018   HCT 46.0 02/14/2018   PLT 253.0 02/14/2018   GLUCOSE 97 02/14/2018   CHOL 129 12/27/2017   TRIG 101.0 12/27/2017   HDL 29.90 (L) 12/27/2017   LDLDIRECT 167.4 12/11/2013   LDLCALC 79 12/27/2017   ALT 30 02/14/2018   AST 22 02/14/2018   NA 139 02/14/2018   K 4.1 02/14/2018   CL 104 02/14/2018   CREATININE 1.08 02/14/2018   BUN 17 02/14/2018   CO2 29 02/14/2018   TSH 2.75 12/27/2017   PSA 1.17 12/27/2017   HGBA1C 5.7 12/27/2017   MICROALBUR 1.0 12/14/2016          Assessment & Plan:

## 2018-12-27 NOTE — Assessment & Plan Note (Signed)

## 2018-12-27 NOTE — Patient Instructions (Signed)
Please continue all other medications as before, and refills have been done if requested.  Please have the pharmacy call with any other refills you may need.  Please continue your efforts at being more active, low cholesterol diet, and weight control.  You are otherwise up to date with prevention measures today.  Please keep your appointments with your specialists as you may have planned  Please go to the LAB in the Basement (turn left off the elevator) for the tests to be done at your convenience  You will be contacted by phone if any changes need to be made immediately.  Otherwise, you will receive a letter about your results with an explanation  Please return in 1 year for your yearly visit, or sooner if needed, with Lab testing done 3-5 days before

## 2018-12-28 ENCOUNTER — Encounter: Payer: Self-pay | Admitting: Internal Medicine

## 2018-12-28 ENCOUNTER — Other Ambulatory Visit (INDEPENDENT_AMBULATORY_CARE_PROVIDER_SITE_OTHER): Payer: Federal, State, Local not specified - PPO

## 2018-12-28 DIAGNOSIS — Z0000 Encounter for general adult medical examination without abnormal findings: Secondary | ICD-10-CM | POA: Diagnosis not present

## 2018-12-28 DIAGNOSIS — R739 Hyperglycemia, unspecified: Secondary | ICD-10-CM | POA: Diagnosis not present

## 2018-12-28 LAB — BASIC METABOLIC PANEL
BUN: 18 mg/dL (ref 6–23)
CALCIUM: 9.6 mg/dL (ref 8.4–10.5)
CHLORIDE: 104 meq/L (ref 96–112)
CO2: 29 meq/L (ref 19–32)
Creatinine, Ser: 1.08 mg/dL (ref 0.40–1.50)
GFR: 73.69 mL/min (ref >60.00)
GLUCOSE: 96 mg/dL (ref 70–99)
POTASSIUM: 4.6 meq/L (ref 3.5–5.1)
SODIUM: 141 meq/L (ref 135–145)

## 2018-12-28 LAB — LIPID PANEL
CHOLESTEROL: 140 mg/dL (ref 0–200)
HDL: 39.7 mg/dL (ref >39.00)
LDL Cholesterol: 82 mg/dL (ref 0–99)
NonHDL: 100.12
TRIGLYCERIDES: 91 mg/dL (ref 0.0–149.0)
Total CHOL/HDL Ratio: 4
VLDL: 18.2 mg/dL (ref 0.0–40.0)

## 2018-12-28 LAB — CBC WITH DIFFERENTIAL/PLATELET
BASOS PCT: 0.8 % (ref 0.0–3.0)
Basophils Absolute: 0.1 10*3/uL (ref 0.0–0.1)
EOS ABS: 0.3 10*3/uL (ref 0.0–0.7)
EOS PCT: 3.9 % (ref 0.0–5.0)
HCT: 44 % (ref 39.0–52.0)
Hemoglobin: 14.7 g/dL (ref 13.0–17.0)
Lymphocytes Relative: 34.6 % (ref 12.0–46.0)
Lymphs Abs: 2.7 10*3/uL (ref 0.7–4.0)
MCHC: 33.4 g/dL (ref 30.0–36.0)
MCV: 95.2 fl (ref 78.0–100.0)
MONO ABS: 0.7 10*3/uL (ref 0.1–1.0)
Monocytes Relative: 8.3 % (ref 3.0–12.0)
Neutro Abs: 4.2 10*3/uL (ref 1.4–7.7)
Neutrophils Relative %: 52.4 % (ref 43.0–77.0)
PLATELETS: 245 10*3/uL (ref 150.0–400.0)
RBC: 4.62 Mil/uL (ref 4.22–5.81)
RDW: 12.6 % (ref 11.5–15.5)
WBC: 7.9 10*3/uL (ref 4.0–10.5)

## 2018-12-28 LAB — URINALYSIS, ROUTINE W REFLEX MICROSCOPIC
BILIRUBIN URINE: NEGATIVE
KETONES UR: NEGATIVE
LEUKOCYTES UA: NEGATIVE
NITRITE: NEGATIVE
PH: 5.5 (ref 5.0–8.0)
SPECIFIC GRAVITY, URINE: 1.015 (ref 1.000–1.030)
Total Protein, Urine: NEGATIVE
URINE GLUCOSE: NEGATIVE
UROBILINOGEN UA: 0.2 (ref 0.0–1.0)

## 2018-12-28 LAB — PSA: PSA: 1.32 ng/mL (ref 0.10–4.00)

## 2018-12-28 LAB — HEPATIC FUNCTION PANEL
ALT: 26 U/L (ref 0–53)
AST: 18 U/L (ref 0–37)
Albumin: 4.7 g/dL (ref 3.5–5.2)
Alkaline Phosphatase: 69 U/L (ref 39–117)
BILIRUBIN DIRECT: 0.2 mg/dL (ref 0.0–0.3)
BILIRUBIN TOTAL: 0.9 mg/dL (ref 0.2–1.2)
Total Protein: 7.2 g/dL (ref 6.0–8.3)

## 2018-12-28 LAB — HEMOGLOBIN A1C: HEMOGLOBIN A1C: 5.6 % (ref 4.6–6.5)

## 2018-12-28 LAB — TSH: TSH: 2.07 u[IU]/mL (ref 0.35–4.50)

## 2018-12-31 DIAGNOSIS — C44319 Basal cell carcinoma of skin of other parts of face: Secondary | ICD-10-CM | POA: Diagnosis not present

## 2019-01-02 ENCOUNTER — Other Ambulatory Visit: Payer: Self-pay | Admitting: Internal Medicine

## 2019-01-07 DIAGNOSIS — Z4802 Encounter for removal of sutures: Secondary | ICD-10-CM | POA: Diagnosis not present

## 2019-01-31 ENCOUNTER — Encounter: Payer: Self-pay | Admitting: Gastroenterology

## 2019-06-11 ENCOUNTER — Other Ambulatory Visit: Payer: Self-pay | Admitting: Internal Medicine

## 2019-06-11 NOTE — Telephone Encounter (Signed)
Done erx 

## 2019-09-10 ENCOUNTER — Other Ambulatory Visit: Payer: Self-pay | Admitting: Internal Medicine

## 2019-10-03 ENCOUNTER — Other Ambulatory Visit: Payer: Self-pay

## 2019-10-03 ENCOUNTER — Ambulatory Visit (INDEPENDENT_AMBULATORY_CARE_PROVIDER_SITE_OTHER): Payer: Federal, State, Local not specified - PPO

## 2019-10-03 DIAGNOSIS — Z23 Encounter for immunization: Secondary | ICD-10-CM

## 2019-11-28 DIAGNOSIS — L821 Other seborrheic keratosis: Secondary | ICD-10-CM | POA: Diagnosis not present

## 2019-11-28 DIAGNOSIS — L720 Epidermal cyst: Secondary | ICD-10-CM | POA: Diagnosis not present

## 2019-11-28 DIAGNOSIS — L570 Actinic keratosis: Secondary | ICD-10-CM | POA: Diagnosis not present

## 2019-11-28 DIAGNOSIS — Z85828 Personal history of other malignant neoplasm of skin: Secondary | ICD-10-CM | POA: Diagnosis not present

## 2019-11-28 DIAGNOSIS — L918 Other hypertrophic disorders of the skin: Secondary | ICD-10-CM | POA: Diagnosis not present

## 2019-12-25 ENCOUNTER — Other Ambulatory Visit (INDEPENDENT_AMBULATORY_CARE_PROVIDER_SITE_OTHER): Payer: Federal, State, Local not specified - PPO

## 2019-12-25 ENCOUNTER — Telehealth: Payer: Self-pay

## 2019-12-25 DIAGNOSIS — Z0000 Encounter for general adult medical examination without abnormal findings: Secondary | ICD-10-CM | POA: Diagnosis not present

## 2019-12-25 DIAGNOSIS — R739 Hyperglycemia, unspecified: Secondary | ICD-10-CM

## 2019-12-25 DIAGNOSIS — Z125 Encounter for screening for malignant neoplasm of prostate: Secondary | ICD-10-CM

## 2019-12-25 LAB — TSH: TSH: 1.13 u[IU]/mL (ref 0.35–4.50)

## 2019-12-25 LAB — URINALYSIS, ROUTINE W REFLEX MICROSCOPIC
Bilirubin Urine: NEGATIVE
Hgb urine dipstick: NEGATIVE
Ketones, ur: NEGATIVE
Leukocytes,Ua: NEGATIVE
Nitrite: NEGATIVE
RBC / HPF: NONE SEEN (ref 0–?)
Specific Gravity, Urine: 1.025 (ref 1.000–1.030)
Total Protein, Urine: NEGATIVE
Urine Glucose: NEGATIVE
Urobilinogen, UA: 0.2 (ref 0.0–1.0)
WBC, UA: NONE SEEN (ref 0–?)
pH: 5.5 (ref 5.0–8.0)

## 2019-12-25 LAB — CBC WITH DIFFERENTIAL/PLATELET
Basophils Absolute: 0.1 10*3/uL (ref 0.0–0.1)
Basophils Relative: 0.8 % (ref 0.0–3.0)
Eosinophils Absolute: 0.2 10*3/uL (ref 0.0–0.7)
Eosinophils Relative: 2.6 % (ref 0.0–5.0)
HCT: 43.7 % (ref 39.0–52.0)
Hemoglobin: 14.8 g/dL (ref 13.0–17.0)
Lymphocytes Relative: 32.7 % (ref 12.0–46.0)
Lymphs Abs: 2.7 10*3/uL (ref 0.7–4.0)
MCHC: 33.8 g/dL (ref 30.0–36.0)
MCV: 94.2 fl (ref 78.0–100.0)
Monocytes Absolute: 0.6 10*3/uL (ref 0.1–1.0)
Monocytes Relative: 6.9 % (ref 3.0–12.0)
Neutro Abs: 4.7 10*3/uL (ref 1.4–7.7)
Neutrophils Relative %: 57 % (ref 43.0–77.0)
Platelets: 286 10*3/uL (ref 150.0–400.0)
RBC: 4.64 Mil/uL (ref 4.22–5.81)
RDW: 12.3 % (ref 11.5–15.5)
WBC: 8.3 10*3/uL (ref 4.0–10.5)

## 2019-12-25 LAB — LIPID PANEL
Cholesterol: 149 mg/dL (ref 0–200)
HDL: 35.8 mg/dL — ABNORMAL LOW (ref >39.00)
LDL Cholesterol: 84 mg/dL (ref 0–99)
NonHDL: 113.44
Total CHOL/HDL Ratio: 4
Triglycerides: 145 mg/dL (ref 0.0–149.0)
VLDL: 29 mg/dL (ref 0.0–40.0)

## 2019-12-25 LAB — HEPATIC FUNCTION PANEL
ALT: 34 U/L (ref 0–53)
AST: 23 U/L (ref 0–37)
Albumin: 4.6 g/dL (ref 3.5–5.2)
Alkaline Phosphatase: 70 U/L (ref 39–117)
Bilirubin, Direct: 0.2 mg/dL (ref 0.0–0.3)
Total Bilirubin: 1.2 mg/dL (ref 0.2–1.2)
Total Protein: 7.2 g/dL (ref 6.0–8.3)

## 2019-12-25 LAB — BASIC METABOLIC PANEL
BUN: 17 mg/dL (ref 6–23)
CO2: 27 mEq/L (ref 19–32)
Calcium: 9.5 mg/dL (ref 8.4–10.5)
Chloride: 103 mEq/L (ref 96–112)
Creatinine, Ser: 1.03 mg/dL (ref 0.40–1.50)
GFR: 72.99 mL/min (ref >60.00)
Glucose, Bld: 94 mg/dL (ref 70–99)
Potassium: 4.3 mEq/L (ref 3.5–5.1)
Sodium: 140 mEq/L (ref 135–145)

## 2019-12-25 LAB — HEMOGLOBIN A1C: Hgb A1c MFr Bld: 5.8 % (ref 4.6–6.5)

## 2019-12-25 LAB — PSA: PSA: 1.4 ng/mL (ref 0.10–4.00)

## 2019-12-25 NOTE — Telephone Encounter (Signed)
Pt lab been ordered today by Dr. Jenny Reichmann.

## 2019-12-25 NOTE — Telephone Encounter (Signed)
Copied from [place] [id number]. Topic: General - Inquiry >> Dec 25, 2019 10:03 AM Richardo Priest, NT wrote: Reason for CRM: Pt called in stating he would like the lab order to be placed for his upcoming physical. Please advise.

## 2019-12-30 ENCOUNTER — Ambulatory Visit (INDEPENDENT_AMBULATORY_CARE_PROVIDER_SITE_OTHER): Payer: Federal, State, Local not specified - PPO | Admitting: Internal Medicine

## 2019-12-30 ENCOUNTER — Encounter: Payer: Self-pay | Admitting: Internal Medicine

## 2019-12-30 ENCOUNTER — Other Ambulatory Visit: Payer: Self-pay

## 2019-12-30 VITALS — BP 148/90 | HR 76 | Temp 98.8°F | Resp 12 | Ht 67.0 in | Wt 185.0 lb

## 2019-12-30 DIAGNOSIS — R739 Hyperglycemia, unspecified: Secondary | ICD-10-CM

## 2019-12-30 DIAGNOSIS — M25562 Pain in left knee: Secondary | ICD-10-CM | POA: Insufficient documentation

## 2019-12-30 DIAGNOSIS — E611 Iron deficiency: Secondary | ICD-10-CM

## 2019-12-30 DIAGNOSIS — Z0000 Encounter for general adult medical examination without abnormal findings: Secondary | ICD-10-CM

## 2019-12-30 DIAGNOSIS — E559 Vitamin D deficiency, unspecified: Secondary | ICD-10-CM

## 2019-12-30 DIAGNOSIS — I10 Essential (primary) hypertension: Secondary | ICD-10-CM | POA: Diagnosis not present

## 2019-12-30 DIAGNOSIS — E538 Deficiency of other specified B group vitamins: Secondary | ICD-10-CM

## 2019-12-30 DIAGNOSIS — G8929 Other chronic pain: Secondary | ICD-10-CM

## 2019-12-30 MED ORDER — ATORVASTATIN CALCIUM 20 MG PO TABS: 20.0000 mg | ORAL_TABLET | Freq: Every day | ORAL | 3 refills | Status: DC

## 2019-12-30 MED ORDER — HYDROCHLOROTHIAZIDE 25 MG PO TABS: 25.0000 mg | ORAL_TABLET | Freq: Every day | ORAL | 3 refills | Status: DC

## 2019-12-30 MED ORDER — AMLODIPINE BESYLATE 5 MG PO TABS: 5.0000 mg | ORAL_TABLET | Freq: Every day | ORAL | 3 refills | Status: DC

## 2019-12-30 MED ORDER — METOPROLOL TARTRATE 50 MG PO TABS: 50.0000 mg | ORAL_TABLET | Freq: Two times a day (BID) | ORAL | 3 refills | Status: DC

## 2019-12-30 NOTE — Assessment & Plan Note (Signed)

## 2019-12-30 NOTE — Patient Instructions (Addendum)
Please take all new medication as prescribed - the amlodipine 5 mg per day for blood pressure  Please continue all other medications as before, and refills have been done if requested.  Please have the pharmacy call with any other refills you may need.  Please continue your efforts at being more active, low cholesterol diet, and weight control.  You are otherwise up to date with prevention measures today.  Please keep your appointments with your specialists as you may have planned  Ok to take the Tumeric OTC for the left knee pain as well  Please make an Appt with Sports Medicine at the first floor for the left knee pain as you leave today  Please remember you can call for your colonoscopy to be scheduled before returning here in 1 yr if you are comfortable with this  Please return in 1 year for your yearly visit, or sooner if needed, with Lab testing done 3-5 days before at the [facility] - please make appt as you leave today for this

## 2019-12-30 NOTE — Assessment & Plan Note (Signed)
Etiology unclear, to continue brace, and fu sport med

## 2019-12-30 NOTE — Assessment & Plan Note (Signed)
stable overall by history and exam, recent data reviewed with pt, and pt to continue medical treatment as before,  to f/u any worsening symptoms or concerns  

## 2019-12-30 NOTE — Progress Notes (Signed)
Subjective:    Patient ID: Martin Walker, male    DOB: [birth year], 64 y.o.   MRN: [id number]  HPI  Here for wellness and f/u;  Overall doing ok;  Pt denies Chest pain, worsening SOB, DOE, wheezing, orthopnea, PND, worsening LE edema, palpitations, dizziness or syncope.  Pt denies neurological change such as new headache, facial or extremity weakness.  Pt denies polydipsia, polyuria, or low sugar symptoms. Pt states overall good compliance with treatment and medications, good tolerability, and has been trying to follow appropriate diet.  Pt denies worsening depressive symptoms, suicidal ideation or panic. No fever, night sweats, wt loss, loss of appetite, or other constitutional symptoms.  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   BP at home has been mildly elevated today but has been < 140/90 at home except when excited after arguing with wife, and then started a walkng program up to 130 miles per month, then hurt the lef tknee in august so only getting 1 mile per day now until knee is better.  Also, Did have a left knee strain recently and hyperextended with pain anteriorly with a misstep getting a bike out of a truck and stepped in a depression.   Did lose some wt initialy then gained again recenlty some of it must be muscle mass.  Also doing some light wts and isometric excercises.  Also occas riding his bike more.  No longer eating poorly at [employer] as well only real oatmeal every am, and better overall diet, lower carbs, more fruit.  Eating 2 cans tuna and 2 cans sardines for omega 3.  Also did not get colonscopy end 2020 as planned and wants to wait until later until pandemic.  Wt Readings from Last 3 Encounters:  12/30/19 185 lb (83.9 kg)  12/27/18 178 lb (80.7 kg)  12/10/18 176 lb 9.6 oz (80.1 kg)   Past  Medical History:  Diagnosis Date  . Asthma    chilhood  . Rhinitis, allergic    skin test pos 11/26/08- dust,cat  . Rhinosinusitis    recurrent   Past Surgical History:  Procedure Laterality Date  . COLONOSCOPY    . left knee    . right wrist    . ROTATOR CUFF REPAIR     left    reports that he quit smoking about 8 years ago. His smoking use included cigarettes. He has a 52.50 pack-year smoking history. He has quit using smokeless tobacco. He reports current alcohol use. He reports current drug use. Drug: Marijuana. family history includes Colon cancer in an other family member; Heart disease in his father; Hypertension in his mother; Lung cancer in an other family member; Other in an other family member; Thyroid disease in his mother. Allergies  Allergen Reactions  . Codeine     Causes peeling of the hands   Current Outpatient Medications on File Prior to Visit  Medication Sig Dispense Refill  . ALPRAZolam (XANAX) 0.25 MG tablet TAKE 1 TABLET BY MOUTH EVERY 6 HOURS AS NEEDED FOR  HEART  RACING 30 tablet 2  . ALREX 0.2 % SUSP As directed    . aspirin EC 81 MG tablet Take 1 tablet (81 mg total) by mouth daily. 90 tablet 11  . atorvastatin (LIPITOR) 20 MG tablet Take 1 tablet (20 mg total)  by mouth daily. 90 tablet 3  . cyclobenzaprine (FLEXERIL) 5 MG tablet Take 1 tablet (5 mg total) by mouth 3 (three) times daily as needed. for muscle spams 60 tablet 2  . fluticasone (FLONASE) 50 MCG/ACT nasal spray USE 2 SPRAY(S) IN EACH NOSTRIL ONCE DAILY 32 g 5  . hydrochlorothiazide (HYDRODIURIL) 25 MG tablet Take 1 tablet (25 mg total) by mouth daily. 90 tablet 3  . metoprolol tartrate (LOPRESSOR) 50 MG tablet Take 1 tablet by mouth twice daily 180 tablet 0  . valACYclovir (VALTREX) 1000 MG tablet Take 1 tablet (1,000 mg total) by mouth 3 (three) times daily. As needed for cold sore episodes 21 tablet 3  . diphenoxylate-atropine (LOMOTIL) 2.5-0.025 MG tablet Take 1 tablet by mouth 4 (four)  times daily as needed for diarrhea or loose stools. (Patient not taking: Reported on 12/30/2019) 30 tablet 1  . meloxicam (MOBIC) 15 MG tablet Take 1 tablet (15 mg total) by mouth daily. (Patient not taking: Reported on 12/30/2019) 30 tablet 0   Current Facility-Administered Medications on File Prior to Visit  Medication Dose Route Frequency Provider Last Rate Last Admin  . ondansetron (ZOFRAN) injection 4 mg  4 mg Intramuscular Q8H PRN Biagio Borg, MD   4 mg at 02/14/18 1137    Review of Systems Constitutional: Negative for other unusual diaphoresis, sweats, appetite or weight changes HENT: Negative for other worsening hearing loss, ear pain, facial swelling, mouth sores or neck stiffness.   Eyes: Negative for other worsening pain, redness or other visual disturbance.  Respiratory: Negative for other stridor or swelling Cardiovascular: Negative for other palpitations or other chest pain  Gastrointestinal: Negative for worsening diarrhea or loose stools, blood in stool, distention or other pain Genitourinary: Negative for hematuria, flank pain or other change in urine volume.  Musculoskeletal: Negative for myalgias or other joint swelling.  Skin: Negative for other color change, or other wound or worsening drainage.  Neurological: Negative for other syncope or numbness. Hematological: Negative for other adenopathy or swelling Psychiatric/Behavioral: Negative for hallucinations, other worsening agitation, SI, self-injury, or new decreased concentration All otherwise neg per pt     Objective:   Physical Exam BP (!) 148/90   Pulse 76   Temp 98.8 F (37.1 C)   Resp 12   Ht 5\' 7"  (1.702 m)   Wt 185 lb (83.9 kg)   SpO2 95%   BMI 28.98 kg/m  VS noted,  Constitutional: Pt is oriented to person, place, and time. Appears well-developed and well-nourished, in no significant distress and comfortable Head: Normocephalic and atraumatic  Eyes: Conjunctivae and EOM are normal. Pupils are equal,  round, and reactive to light Right Ear: External ear normal without discharge Left Ear: External ear normal without discharge Nose: Nose without discharge or deformity Mouth/Throat: Oropharynx is without other ulcerations and moist  Neck: Normal range of motion. Neck supple. No JVD present. No tracheal deviation present or significant neck LA or mass Cardiovascular: Normal rate, regular rhythm, normal heart sounds and intact distal pulses.   Pulmonary/Chest: WOB normal and breath sounds without rales or wheezing  Abdominal: Soft. Bowel sounds are normal. NT. No HSM  Musculoskeletal: Normal range of motion. Exhibits no edema Lymphadenopathy: Has no other cervical adenopathy.  Neurological: Pt is alert and oriented to person, place, and time. Pt has normal reflexes. No cranial nerve deficit. Motor grossly intact, Gait intact Skin: Skin is warm and dry. No rash noted or new ulcerations Psychiatric:  Has normal mood and affect.  Behavior is normal without agitation All otherwise neg per pt Lab Results  Component Value Date   WBC 8.3 12/25/2019   HGB 14.8 12/25/2019   HCT 43.7 12/25/2019   PLT 286.0 12/25/2019   GLUCOSE 94 12/25/2019   CHOL 149 12/25/2019   TRIG 145.0 12/25/2019   HDL 35.80 (L) 12/25/2019   LDLDIRECT 167.4 12/11/2013   LDLCALC 84 12/25/2019   ALT 34 12/25/2019   AST 23 12/25/2019   NA 140 12/25/2019   K 4.3 12/25/2019   CL 103 12/25/2019   CREATININE 1.03 12/25/2019   BUN 17 12/25/2019   CO2 27 12/25/2019   TSH 1.13 12/25/2019   PSA 1.40 12/25/2019   HGBA1C 5.8 12/25/2019   MICROALBUR 1.0 12/14/2016      Assessment & Plan:

## 2019-12-30 NOTE — Assessment & Plan Note (Addendum)
Mild uncontrolled, to add amlodipine 5 qd, o/w stable overall by history and exam, recent data reviewed with pt, and pt to continue medical treatment as before,  to f/u any worsening symptoms or concerns

## 2020-01-01 DIAGNOSIS — Z7189 Other specified counseling: Secondary | ICD-10-CM | POA: Insufficient documentation

## 2020-01-01 NOTE — Progress Notes (Signed)
Cardiology Office Note   Date:  01/02/2020   ID:  Martin Walker, Martin Walker 1956/04/02, MRN [id number]  PCP:  Biagio Borg, MD  Cardiologist:   Minus Breeding, MD   Chief Complaint  Patient presents with  . Palpitations      History of Present Illness: Martin Walker is a 64 y.o. male who presents for evaluation of palpitations.  He had some chest pain at the last visit.  I sent him for a POET (Plain Old Exercise Treadmill) which was non diagnostic.  Perfusion study was negative for ischemia.  Since I saw him he has retired.  His palpitations are much improved.  He has been doing some exercise.  He is eating better.  He walks.  He rides his bike although he everted his knee.  He did gain a little weight after hurting his knee.  He is not having any chest pressure, neck or arm discomfort.  He is having no new shortness of breath, PND or orthopnea.  Past Medical History:  Diagnosis Date  . Asthma    chilhood  . Rhinitis, allergic    skin test pos 11/26/08- dust,cat  . Rhinosinusitis    recurrent    Past Surgical History:  Procedure Laterality Date  . COLONOSCOPY    . left knee    . right wrist    . ROTATOR CUFF REPAIR     left     Current Outpatient Medications  Medication Sig Dispense Refill  . ALPRAZolam (XANAX) 0.25 MG tablet TAKE 1 TABLET BY MOUTH EVERY 6 HOURS AS NEEDED FOR  HEART  RACING 30 tablet 2  . ALREX 0.2 % SUSP As directed    . amLODipine (NORVASC) 5 MG tablet Take 1 tablet (5 mg total) by mouth daily. 90 tablet 3  . aspirin EC 81 MG tablet Take 1 tablet (81 mg total) by mouth daily. 90 tablet 11  . atorvastatin (LIPITOR) 20 MG tablet Take 1 tablet (20 mg total) by mouth daily. 90 tablet 3  . cyclobenzaprine (FLEXERIL) 5 MG tablet Take 1 tablet (5 mg total) by mouth 3 (three) times daily as needed. for muscle spams 60 tablet 2  . diphenoxylate-atropine (LOMOTIL) 2.5-0.025 MG tablet Take 1 tablet by mouth 4 (four) times daily as needed for diarrhea or  loose stools. 30 tablet 1  . fluticasone (FLONASE) 50 MCG/ACT nasal spray USE 2 SPRAY(S) IN EACH NOSTRIL ONCE DAILY 32 g 5  . hydrochlorothiazide (HYDRODIURIL) 25 MG tablet Take 1 tablet (25 mg total) by mouth daily. 90 tablet 3  . meloxicam (MOBIC) 15 MG tablet Take 1 tablet (15 mg total) by mouth daily. 30 tablet 0  . metoprolol tartrate (LOPRESSOR) 50 MG tablet Take 1 tablet (50 mg total) by mouth 2 (two) times daily. 180 tablet 3  . valACYclovir (VALTREX) 1000 MG tablet Take 1 tablet (1,000 mg total) by mouth 3 (three) times daily. As needed for cold sore episodes 21 tablet 3   Current Facility-Administered Medications  Medication Dose Route Frequency Provider Last Rate Last Admin  . ondansetron (ZOFRAN) injection 4 mg  4 mg Intramuscular Q8H PRN Biagio Borg, MD   4 mg at 02/14/18 1137    Allergies:   Codeine    ROS:  Please see the history of present illness.   Otherwise, review of systems are positive for none.   All other systems are reviewed and negative.    PHYSICAL EXAM: VS:  BP (!) 146/80  Pulse 62   Temp 97.7 F (36.5 C)   Ht 5\' 7"  (1.702 m)   Wt 185 lb 12.8 oz (84.3 kg)   SpO2 98%   BMI 29.10 kg/m  , BMI Body mass index is 29.1 kg/m. GENERAL:  Well appearing NECK:  No jugular venous distention, waveform within normal limits, carotid upstroke brisk and symmetric, no bruits, no thyromegaly LUNGS:  Clear to auscultation bilaterally CHEST:  Unremarkable HEART:  PMI not displaced or sustained,S1 and S2 within normal limits, no S3, no S4, no clicks, no rubs, very soft apical systolic murmur nonradiating, no diastolic murmurs ABD:  Flat, positive bowel sounds normal in frequency in pitch, no bruits, no rebound, no guarding, no midline pulsatile mass, no hepatomegaly, no splenomegaly EXT:  2 plus pulses throughout, no edema, no cyanosis no clubbing    EKG:  EKG is ordered today. The ekg ordered today demonstrates sinus rhythm, rate 62, axis normal, intervals within  normal limits, no acute ST-T wave changes.   Recent Labs: 12/25/2019: ALT 34; BUN 17; Creatinine, Ser 1.03; Hemoglobin 14.8; Platelets 286.0; Potassium 4.3; Sodium 140; TSH 1.13    Lipid Panel    Component Value Date/Time   CHOL 149 12/25/2019 1305   TRIG 145.0 12/25/2019 1305   HDL 35.80 (L) 12/25/2019 1305   CHOLHDL 4 12/25/2019 1305   VLDL 29.0 12/25/2019 1305   LDLCALC 84 12/25/2019 1305   LDLDIRECT 167.4 12/11/2013 1347      Wt Readings from Last 3 Encounters:  01/02/20 185 lb 12.8 oz (84.3 kg)  12/30/19 185 lb (83.9 kg)  12/27/18 178 lb (80.7 kg)      Other studies Reviewed: Additional studies/ records that were reviewed today include: Labs. Review of the above records demonstrates:  Please see elsewhere in the note.     ASSESSMENT AND PLAN:  Palpitations -  He is not bothered by the palpitations.  No change in therapy.  We will continue to basal.   HTN - Blood pressure is very slightly elevated but he says it usually in the 120s at 130 and he will keep an eye on this.  Tobacco abuse -  He is still not smoking.   Dyslipidemia - LDL is 84 with an HDL 35.  However, he is improved his diet and wants to get checked again by his primary provider.   Covid Education -  He and I talked about the vaccine.   Current medicines are reviewed at length with the patient today.  The patient does not have concerns regarding medicines.  The following changes have been made:  no change  Labs/ tests ordered today include: None  Orders Placed This Encounter  Procedures  . EKG 12-Lead     Disposition:   FU with me in one year.     Signed, Minus Breeding, MD  01/02/2020 2:57 PM    [facility]

## 2020-01-02 ENCOUNTER — Other Ambulatory Visit: Payer: Self-pay

## 2020-01-02 ENCOUNTER — Encounter: Payer: Self-pay | Admitting: Cardiology

## 2020-01-02 ENCOUNTER — Ambulatory Visit: Payer: Federal, State, Local not specified - PPO | Admitting: Cardiology

## 2020-01-02 VITALS — BP 146/80 | HR 62 | Temp 97.7°F | Ht 67.0 in | Wt 185.8 lb

## 2020-01-02 DIAGNOSIS — Z720 Tobacco use: Secondary | ICD-10-CM

## 2020-01-02 DIAGNOSIS — R079 Chest pain, unspecified: Secondary | ICD-10-CM

## 2020-01-02 DIAGNOSIS — E785 Hyperlipidemia, unspecified: Secondary | ICD-10-CM

## 2020-01-02 DIAGNOSIS — I10 Essential (primary) hypertension: Secondary | ICD-10-CM

## 2020-01-02 DIAGNOSIS — Z7189 Other specified counseling: Secondary | ICD-10-CM

## 2020-01-02 DIAGNOSIS — R002 Palpitations: Secondary | ICD-10-CM | POA: Diagnosis not present

## 2020-01-02 MED ORDER — METOPROLOL TARTRATE 50 MG PO TABS: 50.0000 mg | ORAL_TABLET | Freq: Two times a day (BID) | ORAL | 3 refills | Status: DC

## 2020-01-02 NOTE — Patient Instructions (Signed)
Medication Instructions:  Metoprolol refilled *If you need a refill on your cardiac medications before your next appointment, please call your pharmacy*  Lab Work: None ordered  Testing/Procedures: None ordered  Follow-Up: At [facility], you and your health needs are our priority.  As part of our continuing mission to provide you with exceptional heart care, we have created designated Provider Care Teams.  These Care Teams include your primary Cardiologist (physician) and Advanced Practice Providers (APPs -  Physician Assistants and Nurse Practitioners) who all work together to provide you with the care you need, when you need it.  Your next appointment:   1 year(s)  The format for your next appointment:   In Person  Provider:   Minus Breeding, MD

## 2020-01-09 ENCOUNTER — Other Ambulatory Visit: Payer: Self-pay

## 2020-01-09 ENCOUNTER — Encounter: Payer: Self-pay | Admitting: Family Medicine

## 2020-01-09 ENCOUNTER — Ambulatory Visit (INDEPENDENT_AMBULATORY_CARE_PROVIDER_SITE_OTHER): Payer: Federal, State, Local not specified - PPO

## 2020-01-09 ENCOUNTER — Ambulatory Visit: Payer: Self-pay

## 2020-01-09 ENCOUNTER — Ambulatory Visit (INDEPENDENT_AMBULATORY_CARE_PROVIDER_SITE_OTHER): Payer: Federal, State, Local not specified - PPO | Admitting: Family Medicine

## 2020-01-09 VITALS — BP 158/102 | HR 71 | Ht 67.0 in | Wt 183.8 lb

## 2020-01-09 DIAGNOSIS — G8929 Other chronic pain: Secondary | ICD-10-CM

## 2020-01-09 DIAGNOSIS — M25562 Pain in left knee: Secondary | ICD-10-CM | POA: Diagnosis not present

## 2020-01-09 DIAGNOSIS — M1712 Unilateral primary osteoarthritis, left knee: Secondary | ICD-10-CM | POA: Diagnosis not present

## 2020-01-09 NOTE — Progress Notes (Signed)
X-ray knee shows arthritis more severe at the medial inside part of the knee.  You should hear from the [facility] representative soon about bracing.

## 2020-01-09 NOTE — Progress Notes (Signed)
Subjective:    I'm seeing this patient as a consultation for:  Dr. Jenny Reichmann. Note will be routed back to referring provider/PCP.  CC: L knee pain  HPI: Pt is a 64 y/o male presenting w/ c/o L knee pain since Aug 2020 when he stepped into a hole and hyperextended his L knee.  He currently rates his pain at a 1-2/10 and describes his pain as nagging.  Initially his L knee pain was a 7/10 at the time of injury but pain has improved quite a bit but has not resolved.  He denies any swelling in his knee.  He likes to walk for exercises and has decreased his walking to 1 mile/day due to his knee pain.  Aggravating factors include weight bearing rotation and walking for exercise .  He occasionally will ride his bike and has tried some resisted exercise and isometrics for his legs.  He has also tried a knee brace both Copper Fit and neoprene knee sleeves.  Past medical history, Surgical history, Family history, Social history, Allergies, and medications have been entered into the medical record, reviewed.   Review of Systems: No new headache, visual changes, nausea, vomiting, diarrhea, constipation, dizziness, abdominal pain, skin rash, fevers, chills, night sweats, weight loss, swollen lymph nodes, body aches, joint swelling, muscle aches, chest pain, shortness of breath, mood changes, visual or auditory hallucinations.   Objective:    Vitals:   01/09/20 1355  BP: (!) 158/102  Pulse: 71  SpO2: 95%   General: Well Developed, well nourished, and in no acute distress.  Neuro/Psych: Alert and oriented x3, extra-ocular muscles intact, able to move all 4 extremities, sensation grossly intact. Skin: Warm and dry, no rashes noted.  Respiratory: Not using accessory muscles, speaking in full sentences, trachea midline.  Cardiovascular: Pulses palpable, no extremity edema. Abdomen: Does not appear distended. MSK:  Left knee: No effusion.  Relatively normal-appearing no erythema. Range of motion 0-120  degrees with retropatellar crepitation. Tender palpation medial joint line. Laxity to anterior drawer testing.  No laxity to posterior drawer testing or valgus varus stress testing. Positive medial McMurray's test.  Negative lateral. Intact flexion extension strength.  Contralateral right knee: Normal-appearing no effusion no erythema. Range of motion 0-120 degrees with retropatellar crepitation. Mild tender palpation medial joint line. Mild laxity to anterior drawer testing.  No laxity to posterior drawer testing valgus or varus stress testing. Negative medial McMurray's test negative lateral test. Intact flexion and extension strength.  Lab and Radiology Results  X-ray images left knee obtained today personally and independently reviewed. Significant DJD medial compartment.  Moderate lateral compartment and mild patellofemoral compartment.  No acute fractures. Await radiology overread  Impression and Recommendations:    Assessment and Plan: 64 y.o. male with left knee pain.  Pain is predominantly due to DJD.  Patient may have a chronically torn ACL based on laxity to anterior drawer testing and almost certainly has degenerative meniscus tears however functionally he is doing quite well.  We discussed treatment options for him.  Plan to proceed with trial of Voltaren gel.  He would like to proceed with trial of medial off loader brace.  We will proceed with joint injections in the future if needed.  Proceed with steroid injection, hyaluronic acid injection or even PRP injection.  Recheck as needed.   Orders Placed This Encounter  Procedures  . NO CHG - [place] LOWER LEFT    Order Specific Question:   Reason for Exam (SYMPTOM  OR DIAGNOSIS  REQUIRED)    Answer:   L knee pain    Order Specific Question:   Preferred imaging location?    Answer:   [facility]  . DG Knee AP/LAT W/[place] Left    Standing Status:   Future    Number of Occurrences:   1    Standing  Expiration Date:   03/08/2021    Order Specific Question:   Reason for Exam (SYMPTOM  OR DIAGNOSIS REQUIRED)    Answer:   eval knee pain. Suspect DJD    Order Specific Question:   Preferred imaging location?    Answer:   [facility]    Order Specific Question:   Radiology Contrast Protocol - do NOT remove file path    Answer:   \\[email or web address].pdf   No orders of the defined types were placed in this encounter.   Discussed warning signs or symptoms. Please see discharge instructions. Patient expresses understanding.  The above documentation has been reviewed and is accurate and complete Lynne Leader

## 2020-01-09 NOTE — Patient Instructions (Signed)
Thank you for coming in today. Use over the counter voltaren gel up to 4x daily for pain as needed.  You should hear soon about the knee brace.  We can also try in the future several different types of injections.  Keep me updated on how you are doing.  Work on quad strength and keep the weight off.  Cycling or exercise bike is generally a great exercise.

## 2020-03-25 ENCOUNTER — Other Ambulatory Visit: Payer: Self-pay | Admitting: Internal Medicine

## 2020-04-10 ENCOUNTER — Ambulatory Visit: Payer: Federal, State, Local not specified - PPO | Attending: Internal Medicine

## 2020-04-10 DIAGNOSIS — Z23 Encounter for immunization: Secondary | ICD-10-CM

## 2020-04-10 NOTE — Progress Notes (Signed)
   Covid-19 Vaccination Clinic  Name:  Martin Walker    MRN: [id number] DOB: March 18, 1956  04/10/2020  Mr. Axson was observed post Covid-19 immunization for 15 minutes without incident. He was provided with Vaccine Information Sheet and instruction to access the V-Safe system.   Mr. Sanmiguel was instructed to call 911 with any severe reactions post vaccine: Marland Kitchen Difficulty breathing  . Swelling of face and throat  . A fast heartbeat  . A bad rash all over body  . Dizziness and weakness   Immunizations Administered    Name Date Dose VIS Date Route   Pfizer COVID-19 Vaccine 04/10/2020 11:40 AM 0.3 mL 12/06/2019 Intramuscular   Manufacturer: [place]   Lot: [id number]   [place]: [id number]

## 2020-05-06 ENCOUNTER — Ambulatory Visit: Payer: Federal, State, Local not specified - PPO | Attending: Internal Medicine

## 2020-05-06 DIAGNOSIS — Z23 Encounter for immunization: Secondary | ICD-10-CM

## 2020-05-06 NOTE — Progress Notes (Signed)
   Covid-19 Vaccination Clinic  Name:  Martin Walker    MRN: [id number] DOB: October 30, 1956  05/06/2020  [name] was observed post Covid-19 immunization for 15 minutes without incident. He was provided with Vaccine Information Sheet and instruction to access the V-Safe system.   [name] was instructed to call 911 with any severe reactions post vaccine: Marland Kitchen Difficulty breathing  . Swelling of face and throat  . A fast heartbeat  . A bad rash all over body  . Dizziness and weakness   Immunizations Administered    Name Date Dose VIS Date Route   Pfizer COVID-19 Vaccine 05/06/2020  4:19 PM 0.3 mL 02/19/2019 Intramuscular   Manufacturer: [place]   Lot: [id number]   [place]: [id number]

## 2020-06-23 ENCOUNTER — Other Ambulatory Visit: Payer: Self-pay | Admitting: Internal Medicine

## 2020-09-23 ENCOUNTER — Other Ambulatory Visit: Payer: Self-pay | Admitting: Internal Medicine

## 2020-10-01 ENCOUNTER — Ambulatory Visit (INDEPENDENT_AMBULATORY_CARE_PROVIDER_SITE_OTHER): Payer: Federal, State, Local not specified - PPO | Admitting: *Deleted

## 2020-10-01 ENCOUNTER — Other Ambulatory Visit: Payer: Self-pay

## 2020-10-01 DIAGNOSIS — Z23 Encounter for immunization: Secondary | ICD-10-CM | POA: Diagnosis not present

## 2020-11-27 ENCOUNTER — Ambulatory Visit: Payer: Federal, State, Local not specified - PPO | Attending: Internal Medicine

## 2020-11-27 DIAGNOSIS — Z23 Encounter for immunization: Secondary | ICD-10-CM

## 2020-11-27 NOTE — Progress Notes (Signed)
   Covid-19 Vaccination Clinic  Name:  Martin Walker    MRN: [id number] DOB: October 13, 1956  11/27/2020  Mr. Martin Walker was observed post Covid-19 immunization for 15 minutes without incident. He was provided with Vaccine Information Sheet and instruction to access the V-Safe system.   Mr. Martin Walker was instructed to call 911 with any severe reactions post vaccine: Marland Kitchen Difficulty breathing  . Swelling of face and throat  . A fast heartbeat  . A bad rash all over body  . Dizziness and weakness   Immunizations Administered    Name Date Dose VIS Date Route   Pfizer COVID-19 Vaccine 11/27/2020  1:59 PM 0.3 mL 10/14/2020 Intramuscular   Manufacturer: [place]   Lot: [id number]   NDC: [address]-3

## 2020-12-29 ENCOUNTER — Encounter: Payer: Federal, State, Local not specified - PPO | Admitting: Internal Medicine

## 2020-12-31 NOTE — Progress Notes (Signed)
Cardiology Office Note   Date:  01/01/2021   ID:  Gentle, Hoge 1956/04/08, MRN [id number]  PCP:  Corwin Levins, MD  Cardiologist:   Rollene Rotunda, MD   Chief Complaint  Patient presents with  . Palpitations      History of Present Illness: Martin Walker is a 65 y.o. male who presents for evaluation of palpitations.  He had some chest pain in 2019.  I sent him for a POET (Plain Old Exercise Treadmill) which was non diagnostic.  Perfusion study was negative for ischemia.  Since I last saw him he has done well.  He does not have palpitations since he retired.  He stays very active.  He walked 160 miles last month.  We will even jog a little bit. The patient denies any new symptoms such as chest discomfort, neck or arm discomfort. There has been no new shortness of breath, PND or orthopnea. There have been no reported palpitations, presyncope or syncope.   Past Medical History:  Diagnosis Date  . Asthma    chilhood  . Rhinitis, allergic    skin test pos 11/26/08- dust,cat  . Rhinosinusitis    recurrent    Past Surgical History:  Procedure Laterality Date  . COLONOSCOPY    . left knee    . right wrist    . ROTATOR CUFF REPAIR     left     Current Outpatient Medications  Medication Sig Dispense Refill  . ALPRAZolam (XANAX) 0.25 MG tablet TAKE 1 TABLET BY MOUTH EVERY 6 HOURS AS NEEDED FOR  HEART  RACING 30 tablet 2  . ALREX 0.2 % SUSP As directed    . amLODipine (NORVASC) 5 MG tablet Take 1 tablet (5 mg total) by mouth daily. 90 tablet 3  . aspirin EC 81 MG tablet Take 1 tablet (81 mg total) by mouth daily. 90 tablet 11  . atorvastatin (LIPITOR) 20 MG tablet Take 1 tablet (20 mg total) by mouth daily. 90 tablet 3  . cyclobenzaprine (FLEXERIL) 5 MG tablet Take 1 tablet (5 mg total) by mouth 3 (three) times daily as needed. for muscle spams 60 tablet 2  . diphenoxylate-atropine (LOMOTIL) 2.5-0.025 MG tablet Take 1 tablet by mouth 4 (four) times daily as  needed for diarrhea or loose stools. 30 tablet 1  . fluticasone (FLONASE) 50 MCG/ACT nasal spray Use 2 spray(s) in each nostril once daily 48 g 0  . hydrochlorothiazide (HYDRODIURIL) 25 MG tablet Take 1 tablet (25 mg total) by mouth daily. 90 tablet 3  . meloxicam (MOBIC) 15 MG tablet Take 1 tablet (15 mg total) by mouth daily. 30 tablet 0  . metoprolol tartrate (LOPRESSOR) 50 MG tablet Take 1 tablet (50 mg total) by mouth 2 (two) times daily. 180 tablet 3  . valACYclovir (VALTREX) 1000 MG tablet Take 1 tablet (1,000 mg total) by mouth 3 (three) times daily. As needed for cold sore episodes 21 tablet 3   Current Facility-Administered Medications  Medication Dose Route Frequency Provider Last Rate Last Admin  . ondansetron (ZOFRAN) injection 4 mg  4 mg Intramuscular Q8H PRN Corwin Levins, MD   4 mg at 02/14/18 1137    Allergies:   Codeine    ROS:  Please see the history of present illness.   Otherwise, review of systems are positive for none.   All other systems are reviewed and negative.    PHYSICAL EXAM: VS:  BP 130/90 (BP Location: Left Arm, Patient  Position: Sitting, Cuff Size: Normal)   Pulse (!) 52   Ht 5\' 7"  (1.702 m)   Wt 175 lb (79.4 kg)   BMI 27.41 kg/m  , BMI Body mass index is 27.41 kg/m. GENERAL:  Well appearing NECK:  No jugular venous distention, waveform within normal limits, carotid upstroke brisk and symmetric, no bruits, no thyromegaly LUNGS:  Clear to auscultation bilaterally CHEST:  Unremarkable HEART:  PMI not displaced or sustained,S1 and S2 within normal limits, no S3, no S4, no clicks, no rubs, no murmurs ABD:  Flat, positive bowel sounds normal in frequency in pitch, no bruits, no rebound, no guarding, no midline pulsatile mass, no hepatomegaly, no splenomegaly EXT:  2 plus pulses throughout, no edema, no cyanosis no clubbing  EKG:  EKG is  ordered today. The ekg ordered today demonstrates sinus rhythm, rate 52, axis normal, intervals within normal limits,  no acute ST-T wave changes.   Recent Labs: No results found for requested labs within last 8760 hours.    Lipid Panel    Component Value Date/Time   CHOL 149 12/25/2019 1305   TRIG 145.0 12/25/2019 1305   HDL 35.80 (L) 12/25/2019 1305   CHOLHDL 4 12/25/2019 1305   VLDL 29.0 12/25/2019 1305   LDLCALC 84 12/25/2019 1305   LDLDIRECT 167.4 12/11/2013 1347      Wt Readings from Last 3 Encounters:  01/01/21 175 lb (79.4 kg)  01/09/20 183 lb 12.8 oz (83.4 kg)  01/02/20 185 lb 12.8 oz (84.3 kg)      Other studies Reviewed: Additional studies/ records that were reviewed today include: None. Review of the above records demonstrates:   ASSESSMENT AND PLAN:  Palpitations -  He is no longer having these. No change in therapy.   HTN - Blood pressure is at target.  No change in therapy.   Dyslipidemia - LDL is pending.  He is going to get this done when he sees his primary provider soon.  I taken the liberty of renewing his Lipitor.   Current medicines are reviewed at length with the patient today.  The patient does not have concerns regarding medicines.  The following changes have been made:  no change  Labs/ tests ordered today include: None  Orders Placed This Encounter  Procedures  . EKG 12-Lead     Disposition:   FU with me in one year.     Signed, Minus Breeding, MD  01/01/2021 3:10 PM    [facility]

## 2021-01-01 ENCOUNTER — Ambulatory Visit: Payer: Federal, State, Local not specified - PPO | Admitting: Cardiology

## 2021-01-01 ENCOUNTER — Encounter: Payer: Self-pay | Admitting: Cardiology

## 2021-01-01 ENCOUNTER — Other Ambulatory Visit: Payer: Self-pay

## 2021-01-01 VITALS — BP 130/90 | HR 52 | Ht 67.0 in | Wt 175.0 lb

## 2021-01-01 DIAGNOSIS — I10 Essential (primary) hypertension: Secondary | ICD-10-CM | POA: Diagnosis not present

## 2021-01-01 DIAGNOSIS — R002 Palpitations: Secondary | ICD-10-CM

## 2021-01-01 DIAGNOSIS — E785 Hyperlipidemia, unspecified: Secondary | ICD-10-CM

## 2021-01-01 NOTE — Patient Instructions (Signed)
Medication Instructions:  Your physician recommends that you continue on your current medications as directed. Please refer to the Current Medication list given to you today.   *If you need a refill on your cardiac medications before your next appointment, please call your pharmacy*  Lab Work: NONE  Testing/Procedures: NONE  Follow-Up: At [facility], you and your health needs are our priority.  As part of our continuing mission to provide you with exceptional heart care, we have created designated Provider Care Teams.  These Care Teams include your primary Cardiologist (physician) and Advanced Practice Providers (APPs -  Physician Assistants and Nurse Practitioners) who all work together to provide you with the care you need, when you need it.  We recommend signing up for the patient portal called "MyChart".  Sign up information is provided on this After Visit Summary.  MyChart is used to connect with patients for Virtual Visits (Telemedicine).  Patients are able to view lab/test results, encounter notes, upcoming appointments, etc.  Non-urgent messages can be sent to your provider as well.   To learn more about what you can do with MyChart, go to [email or web address].    Your next appointment:   12 month(s)  The format for your next appointment:   In Person  Provider:   You may see James Hochrein, MD   or one of the following Advanced Practice Providers on your designated Care Team:    Rhonda Barrett, PA-C  Kathryn Lawrence, DNP, ANP    

## 2021-01-08 DIAGNOSIS — R0981 Nasal congestion: Secondary | ICD-10-CM | POA: Diagnosis not present

## 2021-01-08 DIAGNOSIS — Z20828 Contact with and (suspected) exposure to other viral communicable diseases: Secondary | ICD-10-CM | POA: Diagnosis not present

## 2021-01-08 DIAGNOSIS — R051 Acute cough: Secondary | ICD-10-CM | POA: Diagnosis not present

## 2021-01-11 ENCOUNTER — Telehealth: Payer: Self-pay | Admitting: Internal Medicine

## 2021-01-11 NOTE — Telephone Encounter (Signed)
1.Medication Requested: metoprolol tartrate (LOPRESSOR) 50 MG tablet  atorvastatin (LIPITOR) 20 MG tablet  2. Pharmacy (Name, Street, [place]):  [facility]),  - [id number] Phone:  [phone]  Fax:  [phone]      3. On Med List: Y  4. Last Visit with PCP:  1.4.21  5. Next visit date with PCP: 2.3.22  **Patient has six pills left, patient was covid pos on 1.14.22, so needed to reschedule appointment   Agent: Please be advised that RX refills may take up to 3 business days. We ask that you follow-up with your pharmacy.

## 2021-01-12 ENCOUNTER — Other Ambulatory Visit: Payer: Self-pay | Admitting: Family

## 2021-01-12 DIAGNOSIS — R002 Palpitations: Secondary | ICD-10-CM

## 2021-01-12 MED ORDER — METOPROLOL TARTRATE 50 MG PO TABS: 50.0000 mg | ORAL_TABLET | Freq: Two times a day (BID) | ORAL | 0 refills | Status: DC

## 2021-01-12 MED ORDER — ATORVASTATIN CALCIUM 20 MG PO TABS: 20.0000 mg | ORAL_TABLET | Freq: Every day | ORAL | 0 refills | Status: DC

## 2021-01-12 NOTE — Telephone Encounter (Signed)
Unable to leave message for patient- VM is not set up; Refills given on both meds as requested for 90 days; no further refills without OV with his PCP.

## 2021-01-13 ENCOUNTER — Other Ambulatory Visit: Payer: Federal, State, Local not specified - PPO

## 2021-01-19 ENCOUNTER — Encounter: Payer: Federal, State, Local not specified - PPO | Admitting: Internal Medicine

## 2021-01-20 ENCOUNTER — Other Ambulatory Visit (INDEPENDENT_AMBULATORY_CARE_PROVIDER_SITE_OTHER): Payer: Federal, State, Local not specified - PPO

## 2021-01-20 DIAGNOSIS — R739 Hyperglycemia, unspecified: Secondary | ICD-10-CM | POA: Diagnosis not present

## 2021-01-20 DIAGNOSIS — E538 Deficiency of other specified B group vitamins: Secondary | ICD-10-CM

## 2021-01-20 DIAGNOSIS — E559 Vitamin D deficiency, unspecified: Secondary | ICD-10-CM | POA: Diagnosis not present

## 2021-01-20 DIAGNOSIS — Z0000 Encounter for general adult medical examination without abnormal findings: Secondary | ICD-10-CM | POA: Diagnosis not present

## 2021-01-20 DIAGNOSIS — E611 Iron deficiency: Secondary | ICD-10-CM | POA: Diagnosis not present

## 2021-01-20 LAB — CBC WITH DIFFERENTIAL/PLATELET
Basophils Absolute: 0.1 10*3/uL (ref 0.0–0.1)
Basophils Relative: 0.6 % (ref 0.0–3.0)
Eosinophils Absolute: 0.2 10*3/uL (ref 0.0–0.7)
Eosinophils Relative: 2.5 % (ref 0.0–5.0)
HCT: 41.5 % (ref 39.0–52.0)
Hemoglobin: 14.3 g/dL (ref 13.0–17.0)
Lymphocytes Relative: 35.9 % (ref 12.0–46.0)
Lymphs Abs: 3.1 10*3/uL (ref 0.7–4.0)
MCHC: 34.4 g/dL (ref 30.0–36.0)
MCV: 91.3 fl (ref 78.0–100.0)
Monocytes Absolute: 0.7 10*3/uL (ref 0.1–1.0)
Monocytes Relative: 8.4 % (ref 3.0–12.0)
Neutro Abs: 4.6 10*3/uL (ref 1.4–7.7)
Neutrophils Relative %: 52.6 % (ref 43.0–77.0)
Platelets: 272 10*3/uL (ref 150.0–400.0)
RBC: 4.55 Mil/uL (ref 4.22–5.81)
RDW: 12.3 % (ref 11.5–15.5)
WBC: 8.8 10*3/uL (ref 4.0–10.5)

## 2021-01-20 LAB — IBC PANEL
Iron: 81 ug/dL (ref 42–165)
Saturation Ratios: 23.9 % (ref 20.0–50.0)
Transferrin: 242 mg/dL (ref 212.0–360.0)

## 2021-01-20 LAB — HEPATIC FUNCTION PANEL
ALT: 38 U/L (ref 0–53)
AST: 23 U/L (ref 0–37)
Albumin: 4.4 g/dL (ref 3.5–5.2)
Alkaline Phosphatase: 75 U/L (ref 39–117)
Bilirubin, Direct: 0.1 mg/dL (ref 0.0–0.3)
Total Bilirubin: 0.8 mg/dL (ref 0.2–1.2)
Total Protein: 7.2 g/dL (ref 6.0–8.3)

## 2021-01-20 LAB — TSH: TSH: 1.88 u[IU]/mL (ref 0.35–4.50)

## 2021-01-20 LAB — LIPID PANEL
Cholesterol: 132 mg/dL (ref 0–200)
HDL: 33.5 mg/dL — ABNORMAL LOW (ref >39.00)
LDL Cholesterol: 78 mg/dL (ref 0–99)
NonHDL: 98.02
Total CHOL/HDL Ratio: 4
Triglycerides: 101 mg/dL (ref 0.0–149.0)
VLDL: 20.2 mg/dL (ref 0.0–40.0)

## 2021-01-20 LAB — URINALYSIS, ROUTINE W REFLEX MICROSCOPIC
Bilirubin Urine: NEGATIVE
Hgb urine dipstick: NEGATIVE
Ketones, ur: NEGATIVE
Leukocytes,Ua: NEGATIVE
Nitrite: NEGATIVE
RBC / HPF: NONE SEEN (ref 0–?)
Specific Gravity, Urine: >=1.03 — AB (ref 1.000–1.030)
Total Protein, Urine: NEGATIVE
Urine Glucose: NEGATIVE
Urobilinogen, UA: 0.2 (ref 0.0–1.0)
pH: 6 (ref 5.0–8.0)

## 2021-01-20 LAB — BASIC METABOLIC PANEL
BUN: 19 mg/dL (ref 6–23)
CO2: 27 mEq/L (ref 19–32)
Calcium: 9.8 mg/dL (ref 8.4–10.5)
Chloride: 106 mEq/L (ref 96–112)
Creatinine, Ser: 1.03 mg/dL (ref 0.40–1.50)
GFR: 77.12 mL/min (ref >60.00)
Glucose, Bld: 92 mg/dL (ref 70–99)
Potassium: 4.6 mEq/L (ref 3.5–5.1)
Sodium: 142 mEq/L (ref 135–145)

## 2021-01-20 LAB — VITAMIN D 25 HYDROXY (VIT D DEFICIENCY, FRACTURES): VITD: 25.02 ng/mL — ABNORMAL LOW (ref 30.00–100.00)

## 2021-01-20 LAB — VITAMIN B12: Vitamin B-12: 330 pg/mL (ref 211–911)

## 2021-01-20 LAB — PSA: PSA: 1.89 ng/mL (ref 0.10–4.00)

## 2021-01-20 LAB — HEMOGLOBIN A1C: Hgb A1c MFr Bld: 5.8 % (ref 4.6–6.5)

## 2021-01-28 ENCOUNTER — Ambulatory Visit (INDEPENDENT_AMBULATORY_CARE_PROVIDER_SITE_OTHER): Payer: Federal, State, Local not specified - PPO | Admitting: Internal Medicine

## 2021-01-28 ENCOUNTER — Encounter: Payer: Self-pay | Admitting: Internal Medicine

## 2021-01-28 ENCOUNTER — Other Ambulatory Visit: Payer: Self-pay

## 2021-01-28 ENCOUNTER — Telehealth: Payer: Self-pay

## 2021-01-28 VITALS — BP 120/78 | HR 61 | Temp 98.1°F | Ht 67.0 in | Wt 172.0 lb

## 2021-01-28 DIAGNOSIS — Z0000 Encounter for general adult medical examination without abnormal findings: Secondary | ICD-10-CM | POA: Diagnosis not present

## 2021-01-28 DIAGNOSIS — I10 Essential (primary) hypertension: Secondary | ICD-10-CM | POA: Diagnosis not present

## 2021-01-28 DIAGNOSIS — E559 Vitamin D deficiency, unspecified: Secondary | ICD-10-CM | POA: Diagnosis not present

## 2021-01-28 DIAGNOSIS — R002 Palpitations: Secondary | ICD-10-CM

## 2021-01-28 DIAGNOSIS — R739 Hyperglycemia, unspecified: Secondary | ICD-10-CM

## 2021-01-28 DIAGNOSIS — Z86010 Personal history of colonic polyps: Secondary | ICD-10-CM

## 2021-01-28 NOTE — Progress Notes (Signed)
Established Patient Office Visit  Subjective:  Patient ID: Martin Walker, male    DOB: Jan 04, 1956  Age: 65 y.o. MRN: [id number]       Chief Complaint:: wellness exam and low Vit D       HPI:  Elric Tirado is a 65 y.o. male here for wellness exam; overall doing well post covid infection - S/p covid infection with cough, fatigue with covid + jan 14 .  lost wt with better diet.  Walks 3-4 miles per day.  No specific complaints and o/w up to date except due for colonoscopy.  Has not had vit d checked in past   Wt Readings from Last 3 Encounters:  01/28/21 172 lb (78 kg)  01/01/21 175 lb (79.4 kg)  01/09/20 183 lb 12.8 oz (83.4 kg)   BP Readings from Last 3 Encounters:  01/28/21 120/78  01/01/21 130/90  01/09/20 (!) 158/102   Immunization History  Administered Date(s) Administered  . Influenza Split 10/06/2011, 09/20/2012  . Influenza, High Dose Seasonal PF 10/10/2013  . Influenza,inj,Quad PF,6+ Mos 11/04/2014, 10/28/2015, 10/06/2016, 10/11/2017, 10/11/2018, 10/03/2019, 10/01/2020  . PFIZER(Purple Top)SARS-COV-2 Vaccination 04/10/2020, 05/06/2020, 11/27/2020  . Tdap 10/22/2012   Health Maintenance Due  Topic Date Due  . COLONOSCOPY (Pts 45-68yrs Insurance coverage will need to be confirmed)  01/27/2019     Past Medical History:  Diagnosis Date  . Asthma    chilhood  . Rhinitis, allergic    skin test pos 11/26/08- dust,cat  . Rhinosinusitis    recurrent   Past Surgical History:  Procedure Laterality Date  . COLONOSCOPY    . left knee    . right wrist    . ROTATOR CUFF REPAIR     left    reports that he quit smoking about 9 years ago. His smoking use included cigarettes. He has a 52.50 pack-year smoking history. He has quit using smokeless tobacco. He reports current alcohol use. He reports current drug use. Drug: Marijuana. family history includes Colon cancer in an other family member; Heart disease in his father; Hypertension in his mother; Lung cancer in  an other family member; Other in an other family member; Thyroid disease in his mother. Allergies  Allergen Reactions  . Codeine     Causes peeling of the hands   Current Outpatient Medications on File Prior to Visit  Medication Sig Dispense Refill  . ALPRAZolam (XANAX) 0.25 MG tablet TAKE 1 TABLET BY MOUTH EVERY 6 HOURS AS NEEDED FOR  HEART  RACING 30 tablet 2  . ALREX 0.2 % SUSP As directed    . aspirin EC 81 MG tablet Take 1 tablet (81 mg total) by mouth daily. 90 tablet 11  . cyclobenzaprine (FLEXERIL) 5 MG tablet Take 1 tablet (5 mg total) by mouth 3 (three) times daily as needed. for muscle spams 60 tablet 2  . diphenoxylate-atropine (LOMOTIL) 2.5-0.025 MG tablet Take 1 tablet by mouth 4 (four) times daily as needed for diarrhea or loose stools. 30 tablet 1  . fluticasone (FLONASE) 50 MCG/ACT nasal spray Use 2 spray(s) in each nostril once daily 48 g 0  . meloxicam (MOBIC) 15 MG tablet Take 1 tablet (15 mg total) by mouth daily. 30 tablet 0   Current Facility-Administered Medications on File Prior to Visit  Medication Dose Route Frequency Provider Last Rate Last Admin  . ondansetron (ZOFRAN) injection 4 mg  4 mg Intramuscular Q8H PRN Biagio Borg, MD   4 mg at 02/14/18 1137  ROS:  All others reviewed and negative.  Objective        PE:  BP 120/78   Pulse 61   Temp 98.1 F (36.7 C) (Oral)   Ht 5\' 7"  (1.702 m)   Wt 172 lb (78 kg)   SpO2 97%   BMI 26.94 kg/m                 Constitutional: Pt appears in NAD               HENT: Head: NCAT.                Right Ear: External ear normal.                 Left Ear: External ear normal.                Eyes: . Pupils are equal, round, and reactive to light. Conjunctivae and EOM are normal               Nose: without d/c or deformity               Neck: Neck supple. Gross normal ROM               Cardiovascular: Normal rate and regular rhythm.                 Pulmonary/Chest: Effort normal and breath sounds without rales  or wheezing.                Abd:  Soft, NT, ND, + BS, no organomegaly               Neurological: Pt is alert. At baseline orientation, motor grossly intact               Skin: Skin is warm. No rashes, no other new lesions, LE edema - none               Psychiatric: Pt behavior is normal without agitation   Micro: none  Cardiac tracings I have personally interpreted today:  none  Pertinent Radiological findings (summarize): none   Lab Results  Component Value Date   WBC 8.8 01/20/2021   HGB 14.3 01/20/2021   HCT 41.5 01/20/2021   PLT 272.0 01/20/2021   GLUCOSE 92 01/20/2021   CHOL 132 01/20/2021   TRIG 101.0 01/20/2021   HDL 33.50 (L) 01/20/2021   LDLDIRECT 167.4 12/11/2013   LDLCALC 78 01/20/2021   ALT 38 01/20/2021   AST 23 01/20/2021   NA 142 01/20/2021   K 4.6 01/20/2021   CL 106 01/20/2021   CREATININE 1.03 01/20/2021   BUN 19 01/20/2021   CO2 27 01/20/2021   TSH 1.88 01/20/2021   PSA 1.89 01/20/2021   HGBA1C 5.8 01/20/2021   MICROALBUR 1.0 12/14/2016   Assessment/Plan:  Xuan Menor is a 65 y.o. White or Caucasian [1] male with  has a past medical history of Asthma, Rhinitis, allergic, and Rhinosinusitis. Preventative health care Age and sex appropriate education and counseling updated with regular exercise and diet Referrals for preventative services - for screening colonoscopy Immunizations addressed - none needed Smoking counseling  - none needed Evidence for depression or other mood disorder - none significant Most recent labs reviewed. I have personally reviewed and have noted: 1) the patient's medical and social history 2) The patient's current medications and supplements 3) The patient's height, weight, and BMI have been recorded in the chart  HTN (hypertension) BP Readings from Last 3 Encounters:  01/28/21 120/78  01/01/21 130/90  01/09/20 (!) 158/102   Stable, pt to continue medical treatment amlodipine, Hct, lopressor    Current  Outpatient Medications (Cardiovascular):  .  amLODipine (NORVASC) 5 MG tablet, Take 1 tablet (5 mg total) by mouth daily. Marland Kitchen  atorvastatin (LIPITOR) 20 MG tablet, Take 1 tablet (20 mg total) by mouth daily. .  hydrochlorothiazide (HYDRODIURIL) 25 MG tablet, Take 1 tablet (25 mg total) by mouth daily. .  metoprolol tartrate (LOPRESSOR) 50 MG tablet, Take 1 tablet (50 mg total) by mouth 2 (two) times daily.   Current Outpatient Medications (Respiratory):  .  fluticasone (FLONASE) 50 MCG/ACT nasal spray, Use 2 spray(s) in each nostril once daily   Current Outpatient Medications (Analgesics):  .  aspirin EC 81 MG tablet, Take 1 tablet (81 mg total) by mouth daily. .  meloxicam (MOBIC) 15 MG tablet, Take 1 tablet (15 mg total) by mouth daily.     Current Outpatient Medications (Other):  Marland Kitchen  ALPRAZolam (XANAX) 0.25 MG tablet, TAKE 1 TABLET BY MOUTH EVERY 6 HOURS AS NEEDED FOR  HEART  RACING .  ALREX 0.2 % SUSP, As directed .  cyclobenzaprine (FLEXERIL) 5 MG tablet, Take 1 tablet (5 mg total) by mouth 3 (three) times daily as needed. for muscle spams .  diphenoxylate-atropine (LOMOTIL) 2.5-0.025 MG tablet, Take 1 tablet by mouth 4 (four) times daily as needed for diarrhea or loose stools. .  valACYclovir (VALTREX) 1000 MG tablet, Take 1 tablet (1,000 mg total) by mouth 3 (three) times daily. As needed for cold sore episodes  Current Facility-Administered Medications (Other):  .  ondansetron (ZOFRAN) injection 4 mg  Hyperglycemia Lab Results  Component Value Date   HGBA1C 5.8 01/20/2021   Stable, pt to continue current medical treatment  - diet   Vitamin D deficiency Last vitamin D Lab Results  Component Value Date   VD25OH 25.02 (L) 01/20/2021   Low, to start vit d3 2000 u qd  Followup: Return in about 1 year (around 01/28/2022).  Cathlean Cower, MD 01/31/2021 4:22 AM [facility]

## 2021-01-28 NOTE — Patient Instructions (Signed)
Please continue all other medications as before, and refills have been done if requested.  Please have the pharmacy call with any other refills you may need.  Please continue your efforts at being more active, low cholesterol diet, and weight control.  You are otherwise up to date with prevention measures today.  Please keep your appointments with your specialists as you may have planned  You will be contacted regarding the referral for: colonoscopy  Please take OTC Vitamin D3 at 2000 units per day, indefinitely.  Please make an Appointment to return for your 1 year visit, or sooner if needed, with Lab testing by Appointment as well, to be done about 3-5 days before at the [facility] (so this is for TWO appointments - please see the scheduling desk as you leave)  Due to the ongoing Covid 19 pandemic, our lab now requires an appointment for any labs done at our office.  If you need labs done and do not have an appointment, please call our office ahead of time to schedule before presenting to the lab for your testing.

## 2021-01-31 ENCOUNTER — Encounter: Payer: Self-pay | Admitting: Internal Medicine

## 2021-01-31 DIAGNOSIS — E559 Vitamin D deficiency, unspecified: Secondary | ICD-10-CM | POA: Insufficient documentation

## 2021-01-31 MED ORDER — METOPROLOL TARTRATE 50 MG PO TABS: 50.0000 mg | ORAL_TABLET | Freq: Two times a day (BID) | ORAL | 3 refills | Status: DC

## 2021-01-31 MED ORDER — HYDROCHLOROTHIAZIDE 25 MG PO TABS: 25.0000 mg | ORAL_TABLET | Freq: Every day | ORAL | 3 refills | Status: DC

## 2021-01-31 MED ORDER — ATORVASTATIN CALCIUM 20 MG PO TABS
20.0000 mg | ORAL_TABLET | Freq: Every day | ORAL | 3 refills | Status: DC
Start: 2021-01-31 — End: 2021-04-02

## 2021-01-31 MED ORDER — VALACYCLOVIR HCL 1 G PO TABS: 1000.0000 mg | ORAL_TABLET | Freq: Three times a day (TID) | ORAL | 3 refills | Status: DC

## 2021-01-31 MED ORDER — AMLODIPINE BESYLATE 5 MG PO TABS
5.0000 mg | ORAL_TABLET | Freq: Every day | ORAL | 3 refills | Status: DC
Start: 2021-01-31 — End: 2021-02-19

## 2021-01-31 NOTE — Assessment & Plan Note (Signed)
Lab Results  °Component Value Date  ° HGBA1C 5.8 01/20/2021  ° °Stable, pt to continue current medical treatment  - diet °

## 2021-01-31 NOTE — Assessment & Plan Note (Signed)
Last vitamin D Lab Results  Component Value Date   VD25OH 25.02 (L) 01/20/2021   Low, to start vit d3 2000 u qd

## 2021-01-31 NOTE — Assessment & Plan Note (Addendum)
Age and sex appropriate education and counseling updated with regular exercise and diet Referrals for preventative services - for screening colonoscopy Immunizations addressed - none needed Smoking counseling  - none needed Evidence for depression or other mood disorder - none significant Most recent labs reviewed. I have personally reviewed and have noted: 1) the patient's medical and social history 2) The patient's current medications and supplements 3) The patient's height, weight, and BMI have been recorded in the chart

## 2021-01-31 NOTE — Assessment & Plan Note (Signed)
BP Readings from Last 3 Encounters:  01/28/21 120/78  01/01/21 130/90  01/09/20 (!) 158/102   Stable, pt to continue medical treatment amlodipine, Hct, lopressor    Current Outpatient Medications (Cardiovascular):  .  amLODipine (NORVASC) 5 MG tablet, Take 1 tablet (5 mg total) by mouth daily. Marland Kitchen  atorvastatin (LIPITOR) 20 MG tablet, Take 1 tablet (20 mg total) by mouth daily. .  hydrochlorothiazide (HYDRODIURIL) 25 MG tablet, Take 1 tablet (25 mg total) by mouth daily. .  metoprolol tartrate (LOPRESSOR) 50 MG tablet, Take 1 tablet (50 mg total) by mouth 2 (two) times daily.   Current Outpatient Medications (Respiratory):  .  fluticasone (FLONASE) 50 MCG/ACT nasal spray, Use 2 spray(s) in each nostril once daily   Current Outpatient Medications (Analgesics):  .  aspirin EC 81 MG tablet, Take 1 tablet (81 mg total) by mouth daily. .  meloxicam (MOBIC) 15 MG tablet, Take 1 tablet (15 mg total) by mouth daily.     Current Outpatient Medications (Other):  Marland Kitchen  ALPRAZolam (XANAX) 0.25 MG tablet, TAKE 1 TABLET BY MOUTH EVERY 6 HOURS AS NEEDED FOR  HEART  RACING .  ALREX 0.2 % SUSP, As directed .  cyclobenzaprine (FLEXERIL) 5 MG tablet, Take 1 tablet (5 mg total) by mouth 3 (three) times daily as needed. for muscle spams .  diphenoxylate-atropine (LOMOTIL) 2.5-0.025 MG tablet, Take 1 tablet by mouth 4 (four) times daily as needed for diarrhea or loose stools. .  valACYclovir (VALTREX) 1000 MG tablet, Take 1 tablet (1,000 mg total) by mouth 3 (three) times daily. As needed for cold sore episodes  Current Facility-Administered Medications (Other):  .  ondansetron (ZOFRAN) injection 4 mg

## 2021-02-18 ENCOUNTER — Telehealth: Payer: Self-pay | Admitting: Cardiology

## 2021-02-18 ENCOUNTER — Telehealth: Payer: Self-pay | Admitting: Gastroenterology

## 2021-02-18 NOTE — Telephone Encounter (Signed)
Patient states ever since COVID dx on 01/08/21 his HR has been irregular on and off. He states he has a colonoscopy scheduled for 04/19/21, but he would like to ensure that he is cleared to have local anesthesia for procedure with new symptoms. He plans to call the requesting office so they can contact us for an official clearance request.  Patient c/o Palpitations:  High priority if patient c/o lightheadedness, shortness of breath, or chest pain  1) How long have you had palpitations/irregular HR/ Afib? Are you having the symptoms now?  Patient states his HR has been irregular for the past 2-3 weeks.       He states he is not currently having any symptoms.  2) Are you currently experiencing lightheadedness, SOB or CP? No   3) Do you have a history of afib (atrial fibrillation) or irregular heart rhythm? No    4) Have you checked your BP or HR? (document readings if available):  No readings, but patient states his BP and HR have been fine  5) Are you experiencing any other symptoms? No   STAT if patient feels like he/she is going to faint   1) Are you dizzy now? No   2) Do you feel faint or have you passed out? No   3) Do you have any other symptoms? No   4) Have you checked your HR and BP (record if available)?  No readings, but patient states his BP and HR have been fine

## 2021-02-18 NOTE — Telephone Encounter (Signed)
I see that the pt has not been seen since 2015.  We can wait for a referral from cardiology if that is what they feel necessary.

## 2021-02-18 NOTE — Progress Notes (Signed)
Cardiology Office Note   Date:  02/19/2021   ID:  Martin Walker 10-01-1956, MRN [id number]  PCP:  Biagio Borg, MD  Cardiologist:   Minus Breeding, MD   Chief Complaint  Patient presents with  . Palpitations      History of Present Illness: Martin Walker is a 65 y.o. male who presents for evaluation of palpitations.  He had some chest pain in 2019.  I sent him for a POET (Plain Old Exercise Treadmill) which was non diagnostic.  Perfusion study was negative for ischemia.  Since I last saw him he had COVID.  He had a lot of decreased energy and shortness of breath but finally got over this.  This was in mid January.  He has been back to exercising feeling well.  However, he started having the palpitations again that he had before.  He has noticed some when he is resting.  They are not there when he is exercising.  He might have to lie down to get rid of them.  He might drink something cold and then go away.  He feels like he is not taking deep breaths when he notices this.  He wondered if he could have fluid around his heart so we restarted hydrochlorothiazide which he been taking in the past.  He is not had any presyncope or syncope.  He denies any chest pressure, neck or arm discomfort.  He has had no weight gain or edema.   Past Medical History:  Diagnosis Date  . Asthma    chilhood  . Rhinitis, allergic    skin test pos 11/26/08- dust,cat  . Rhinosinusitis    recurrent    Past Surgical History:  Procedure Laterality Date  . COLONOSCOPY    . left knee    . right wrist    . ROTATOR CUFF REPAIR     left     Current Outpatient Medications  Medication Sig Dispense Refill  . ALPRAZolam (XANAX) 0.25 MG tablet TAKE 1 TABLET BY MOUTH EVERY 6 HOURS AS NEEDED FOR  HEART  RACING 30 tablet 2  . ALREX 0.2 % SUSP Place 2 drops into both eyes as needed. As directed    . aspirin EC 81 MG tablet Take 1 tablet (81 mg total) by mouth daily. 90 tablet 11  . atorvastatin  (LIPITOR) 20 MG tablet Take 1 tablet (20 mg total) by mouth daily. 90 tablet 3  . fluticasone (FLONASE) 50 MCG/ACT nasal spray Use 2 spray(s) in each nostril once daily 48 g 0  . hydrochlorothiazide (HYDRODIURIL) 25 MG tablet Take 1 tablet (25 mg total) by mouth daily. 90 tablet 3  . meloxicam (MOBIC) 15 MG tablet Take 1 tablet (15 mg total) by mouth daily. 30 tablet 0  . metoprolol tartrate (LOPRESSOR) 50 MG tablet Take 1 tablet (50 mg total) by mouth 2 (two) times daily. 180 tablet 3   Current Facility-Administered Medications  Medication Dose Route Frequency Provider Last Rate Last Admin  . ondansetron (ZOFRAN) injection 4 mg  4 mg Intramuscular Q8H PRN Biagio Borg, MD   4 mg at 02/14/18 1137    Allergies:   Codeine    ROS:  Please see the history of present illness.   Otherwise, review of systems are positive for none.   All other systems are reviewed and negative.    PHYSICAL EXAM: VS:  BP 134/88 (BP Location: Left Arm, Patient Position: Sitting)   Pulse (!) 58  Ht 5\' 7"  (1.702 m)   Wt 169 lb 9.6 oz (76.9 kg)   SpO2 96%   BMI 26.56 kg/m  , BMI Body mass index is 26.56 kg/m. GENERAL:  Well appearing NECK:  No jugular venous distention, waveform within normal limits, carotid upstroke brisk and symmetric, no bruits, no thyromegaly LUNGS:  Clear to auscultation bilaterally CHEST:  Unremarkable HEART:  PMI not displaced or sustained,S1 and S2 within normal limits, no S3, no S4, no clicks, no rubs, 2 out of 6 brief apical systolic murmur nonradiating, no diastolic murmurs ABD:  Flat, positive bowel sounds normal in frequency in pitch, no bruits, no rebound, no guarding, no midline pulsatile mass, no hepatomegaly, no splenomegaly EXT:  2 plus pulses throughout, no edema, no cyanosis no clubbing   EKG:  EKG is  ordered today. The ekg ordered today demonstrates sinus rhythm, rate 58, axis normal, intervals within normal limits, no acute ST-T wave changes.   Recent  Labs: 01/20/2021: ALT 38; BUN 19; Creatinine, Ser 1.03; Hemoglobin 14.3; Platelets 272.0; Potassium 4.6; Sodium 142; TSH 1.88    Lipid Panel    Component Value Date/Time   CHOL 132 01/20/2021 0931   TRIG 101.0 01/20/2021 0931   HDL 33.50 (L) 01/20/2021 0931   CHOLHDL 4 01/20/2021 0931   VLDL 20.2 01/20/2021 0931   LDLCALC 78 01/20/2021 0931   LDLDIRECT 167.4 12/11/2013 1347      Wt Readings from Last 3 Encounters:  02/19/21 169 lb 9.6 oz (76.9 kg)  01/28/21 172 lb (78 kg)  01/01/21 175 lb (79.4 kg)      Other studies Reviewed: Additional studies/ records that were reviewed today include: None. Review of the above records demonstrates: NA  ASSESSMENT AND PLAN:  Palpitations -  I do not think he has had any structural damage from his COVID infection.  We are hearing of palpitations or arrhythmias post COVID.  For now I do not think further testing is indicated but he could use an extra half of his metoprolol if he is having significant tachyarrhythmias.  He will let me know if things get worse.  HTN - Blood pressure is at target.  It was at target recently without blood pressure medicine but now is back on HCTZ and I will just have him continue this.   Dyslipidemia - LDL is LDL was 78 with an HDL of 33.  No change in therapy.    Current medicines are reviewed at length with the patient today.  The patient does not have concerns regarding medicines.  The following changes have been made:  no change  Labs/ tests ordered today include: None  Orders Placed This Encounter  Procedures  . Ambulatory referral to Gastroenterology  . EKG 12-Lead     Disposition:   FU with me in one year.     Signed, Minus Breeding, MD  02/19/2021 2:55 PM    [place]

## 2021-02-18 NOTE — Telephone Encounter (Signed)
Spoke with pt, he has noticed an irregular heart beat since he got over covid. He reports he is still doing his walking and his heart rate has nit been racing but is irregular. He has not had any problems for some time and this just started. He is also having occasional dizziness and has to sit down and rest, he reports this is now happening everyday. Follow up scheduled

## 2021-02-19 ENCOUNTER — Other Ambulatory Visit: Payer: Self-pay

## 2021-02-19 ENCOUNTER — Ambulatory Visit: Payer: Federal, State, Local not specified - PPO | Admitting: Cardiology

## 2021-02-19 ENCOUNTER — Encounter: Payer: Self-pay | Admitting: Cardiology

## 2021-02-19 VITALS — BP 134/88 | HR 58 | Ht 67.0 in | Wt 169.6 lb

## 2021-02-19 DIAGNOSIS — R002 Palpitations: Secondary | ICD-10-CM

## 2021-02-19 DIAGNOSIS — R109 Unspecified abdominal pain: Secondary | ICD-10-CM

## 2021-02-19 DIAGNOSIS — I10 Essential (primary) hypertension: Secondary | ICD-10-CM

## 2021-02-19 DIAGNOSIS — E785 Hyperlipidemia, unspecified: Secondary | ICD-10-CM | POA: Diagnosis not present

## 2021-02-19 DIAGNOSIS — K625 Hemorrhage of anus and rectum: Secondary | ICD-10-CM

## 2021-02-19 NOTE — Patient Instructions (Signed)
Medication Instructions:  Restart hydrochlorothiazide Discontinue amlodipine *If you need a refill on your cardiac medications before your next appointment, please call your pharmacy*  Follow-Up: At [facility], you and your health needs are our priority.  As part of our continuing mission to provide you with exceptional heart care, we have created designated Provider Care Teams.  These Care Teams include your primary Cardiologist (physician) and Advanced Practice Providers (APPs -  Physician Assistants and Nurse Practitioners) who all work together to provide you with the care you need, when you need it.  Your next appointment:   January 2023 You will receive a reminder letter in the mail two months in advance. If you don't receive a letter, please call our office to schedule the follow-up appointment.  The format for your next appointment:   In Person  Provider:   Minus Breeding, MD

## 2021-03-02 ENCOUNTER — Other Ambulatory Visit: Payer: Self-pay

## 2021-03-02 ENCOUNTER — Emergency Department (HOSPITAL_COMMUNITY): Payer: Federal, State, Local not specified - PPO

## 2021-03-02 ENCOUNTER — Emergency Department (HOSPITAL_COMMUNITY)
Admission: EM | Admit: 2021-03-02 | Discharge: 2021-03-02 | Disposition: A | Discharge status: Home / Self Care | Payer: Federal, State, Local not specified - PPO | Attending: Emergency Medicine | Admitting: Emergency Medicine

## 2021-03-02 DIAGNOSIS — Z7982 Long term (current) use of aspirin: Secondary | ICD-10-CM | POA: Diagnosis not present

## 2021-03-02 DIAGNOSIS — Z87891 Personal history of nicotine dependence: Secondary | ICD-10-CM | POA: Diagnosis not present

## 2021-03-02 DIAGNOSIS — Y9321 Activity, ice skating: Secondary | ICD-10-CM | POA: Insufficient documentation

## 2021-03-02 DIAGNOSIS — J45909 Unspecified asthma, uncomplicated: Secondary | ICD-10-CM | POA: Insufficient documentation

## 2021-03-02 DIAGNOSIS — S0990XA Unspecified injury of head, initial encounter: Secondary | ICD-10-CM | POA: Diagnosis not present

## 2021-03-02 DIAGNOSIS — W19XXXA Unspecified fall, initial encounter: Secondary | ICD-10-CM

## 2021-03-02 DIAGNOSIS — S0083XA Contusion of other part of head, initial encounter: Secondary | ICD-10-CM | POA: Diagnosis not present

## 2021-03-02 DIAGNOSIS — Z79899 Other long term (current) drug therapy: Secondary | ICD-10-CM | POA: Diagnosis not present

## 2021-03-02 DIAGNOSIS — S199XXA Unspecified injury of neck, initial encounter: Secondary | ICD-10-CM | POA: Diagnosis not present

## 2021-03-02 DIAGNOSIS — I10 Essential (primary) hypertension: Secondary | ICD-10-CM | POA: Diagnosis not present

## 2021-03-02 DIAGNOSIS — Z7951 Long term (current) use of inhaled steroids: Secondary | ICD-10-CM | POA: Insufficient documentation

## 2021-03-02 NOTE — Discharge Instructions (Addendum)
Please return for any problem.  Follow-up with your regular care provider as instructed.  Wear a helmet.

## 2021-03-02 NOTE — ED Provider Notes (Signed)
[facility] EMERGENCY DEPARTMENT Provider Note   CSN: [id number] Arrival date & time: 03/02/21  1813     History Chief Complaint  Patient presents with  . Fall  . Headache  . Neck Pain    Martin Walker is a 65 y.o. male.  65 year old male with prior medical history presents for evaluation.  Patient was exercising.  He was ice-skating.  He tried to [employer] backwards.  He fell.  He struck the back of his head.  He did not pass out.  He was not wearing a helmet.  He denies other injury.  He denies current headache.  He denies associated nausea, visual change, weakness, numbness, or other complaint.   Fall This is a new problem. The current episode started less than 1 hour ago. The problem occurs rarely. The problem has not changed since onset.Nothing aggravates the symptoms. Nothing relieves the symptoms.       Past Medical History:  Diagnosis Date  . Asthma    chilhood  . Rhinitis, allergic    skin test pos 11/26/08- dust,cat  . Rhinosinusitis    recurrent    Patient Active Problem List   Diagnosis Date Noted  . Vitamin D deficiency 01/31/2021  . Educated about COVID-19 virus infection 01/01/2020  . Left knee pain 12/30/2019  . HTN (hypertension) 10/01/2018  . Acute gastroenteritis 02/14/2018  . Abdominal pain 02/14/2018  . Hyperglycemia 12/21/2017  . Right shoulder pain 08/29/2017  . Posterior neck pain 08/29/2017  . Influenza 02/02/2017  . Acute non-recurrent maxillary sinusitis 02/02/2017  . Abscess, wrist 07/13/2016  . Gross hematuria 11/08/2012  . Right flank pain 11/08/2012  . 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 Hypertriglyceridemia 10/20/2011  . Hyperlipidemia 10/06/2011  . Preventative health care 10/06/2011  . HEMORRHOIDS 02/04/2011  . Anxiety state 01/07/2011  . Hemorrhage of rectum and anus 01/07/2011  . Heartburn 01/07/2011  . HEMATOCHEZIA 01/04/2011  . RHINOSINUSITIS, RECURRENT 10/13/2008  . TOBACCO USE  DISORDER/SMOKER-SMOKING CESSATION DISCUSSED 07/02/2008  . GOITER 01/09/2008  . ALLERGIC RHINITIS 01/09/2008  . ASTHMA 01/09/2008  . Palpitations 01/09/2008    Past Surgical History:  Procedure Laterality Date  . COLONOSCOPY    . left knee    . right wrist    . ROTATOR CUFF REPAIR     left       Family History  Problem Relation Age of Onset  . Hypertension Mother   . Thyroid disease Mother   . Heart disease Father   . Lung cancer Other        GRANDFATHER  . Colon cancer Other        UNCLE  . Other Other        GRANDMOTHER WITH BRAIN TUMOR    Social History   Tobacco Use  . Smoking status: Former Smoker    Packs/day: 1.50    Years: 35.00    Pack years: 52.50    Types: Cigarettes    Quit date: 04/24/2011    Years since quitting: 9.8  . Smokeless tobacco: Former Systems developer  . Tobacco comment: quit in early 20's  Vaping Use  . Vaping Use: Never used  Substance Use Topics  . Alcohol use: Yes    Comment: 6 beers/ year  . Drug use: Yes    Types: Marijuana    Comment: age 48 - 82 very little    Home Medications Prior to Admission medications   Medication Sig Start Date End Date Taking? Authorizing Provider  ALPRAZolam Duanne Moron)  0.25 MG tablet TAKE 1 TABLET BY MOUTH EVERY 6 HOURS AS NEEDED FOR  HEART  RACING 06/11/19   Biagio Borg, MD  ALREX 0.2 % SUSP Place 2 drops into both eyes as needed. As directed 11/23/13   [provider]  aspirin EC 81 MG tablet Take 1 tablet (81 mg total) by mouth daily. 12/20/16   Biagio Borg, MD  atorvastatin (LIPITOR) 20 MG tablet Take 1 tablet (20 mg total) by mouth daily. 01/31/21   Biagio Borg, MD  fluticasone Asencion Islam) 50 MCG/ACT nasal spray Use 2 spray(s) in each nostril once daily 09/23/20   Biagio Borg, MD  hydrochlorothiazide (HYDRODIURIL) 25 MG tablet Take 1 tablet (25 mg total) by mouth daily. 01/31/21   Biagio Borg, MD  meloxicam (MOBIC) 15 MG tablet Take 1 tablet (15 mg total) by mouth daily. 09/01/17   Rosemarie Ax,  MD  metoprolol tartrate (LOPRESSOR) 50 MG tablet Take 1 tablet (50 mg total) by mouth 2 (two) times daily. 01/31/21   Biagio Borg, MD    Allergies    Codeine  Review of Systems   Review of Systems  All other systems reviewed and are negative.   Physical Exam Updated Vital Signs BP 137/89   Pulse 72   Temp 98.1 F (36.7 C) (Oral)   Resp 16   SpO2 98%   Physical Exam Vitals and nursing note reviewed.  Constitutional:      General: He is not in acute distress.    Appearance: He is well-developed and well-nourished.  HENT:     Head: Normocephalic.     Comments: Mild contusion to the posterior aspect of the skull.    Mouth/Throat:     Mouth: Oropharynx is clear and moist.  Eyes:     General: No visual field deficit.    Extraocular Movements: EOM normal.     Conjunctiva/sclera: Conjunctivae normal.     Pupils: Pupils are equal, round, and reactive to light.  Cardiovascular:     Rate and Rhythm: Normal rate and regular rhythm.     Heart sounds: Normal heart sounds.  Pulmonary:     Effort: Pulmonary effort is normal. No respiratory distress.     Breath sounds: Normal breath sounds.  Abdominal:     General: There is no distension.     Palpations: Abdomen is soft.     Tenderness: There is no abdominal tenderness.  Musculoskeletal:        General: No deformity or edema. Normal range of motion.     Cervical back: Normal range of motion and neck supple.  Skin:    General: Skin is warm and dry.  Neurological:     Mental Status: He is alert and oriented to person, place, and time.     GCS: GCS eye subscore is 4. GCS verbal subscore is 5. GCS motor subscore is 6.     Cranial Nerves: No cranial nerve deficit, dysarthria or facial asymmetry.  Psychiatric:        Mood and Affect: Mood and affect normal.     ED Results / Procedures / Treatments   Labs (all labs ordered are listed, but only abnormal results are displayed) Labs Reviewed - No data to  display  EKG None  Radiology CT Head Wo Contrast  Result Date: 03/02/2021 CLINICAL DATA:  Fall while skating with head injury, initial encounter EXAM: CT HEAD WITHOUT CONTRAST CT CERVICAL SPINE WITHOUT CONTRAST TECHNIQUE: Multidetector CT imaging of the  head and cervical spine was performed following the standard protocol without intravenous contrast. Multiplanar CT image reconstructions of the cervical spine were also generated. COMPARISON:  None. FINDINGS: CT HEAD FINDINGS Brain: No evidence of acute infarction, hemorrhage, hydrocephalus, extra-axial collection or mass lesion/mass effect. Vascular: No hyperdense vessel or unexpected calcification. Skull: Normal. Negative for fracture or focal lesion. Sinuses/Orbits: No acute finding. Other: None. CT CERVICAL SPINE FINDINGS Alignment: Loss of normal cervical lordosis is noted in the upper cervical spine. 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 This involves the posterior elements as well. Disc space narrowing from C3 to C7 is noted with associated osteophytic changes. Mild facet hypertrophic changes are noted. Mild neural foraminal narrowing at these levels is seen as well. No acute fracture or acute facet abnormality is noted. Soft tissues and spinal canal: Surrounding soft tissue structures show vascular calcifications. No other focal abnormality is noted. Upper chest: Visualized lung apices are within normal limits. Other: None IMPRESSION: CT of the head: No acute intracranial abnormality noted. CT of the cervical spine: Loss of the normal cervical lordosis related to degenerative change. No acute abnormality is noted. Electronically Signed   By: Inez Catalina M.D.   On: 03/02/2021 19:58   CT Cervical Spine Wo Contrast  Result Date: 03/02/2021 CLINICAL DATA:  Fall while skating with head injury, initial encounter EXAM: CT HEAD WITHOUT CONTRAST CT CERVICAL SPINE WITHOUT CONTRAST TECHNIQUE: Multidetector CT imaging of  the head and cervical spine was performed following the standard protocol without intravenous contrast. Multiplanar CT image reconstructions of the cervical spine were also generated. COMPARISON:  None. FINDINGS: CT HEAD FINDINGS Brain: No evidence of acute infarction, hemorrhage, hydrocephalus, extra-axial collection or mass lesion/mass effect. Vascular: No hyperdense vessel or unexpected calcification. Skull: Normal. Negative for fracture or focal lesion. Sinuses/Orbits: No acute finding. Other: None. CT CERVICAL SPINE FINDINGS Alignment: Loss of normal cervical lordosis is noted in the upper cervical spine. 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 This involves the posterior elements as well. Disc space narrowing from C3 to C7 is noted with associated osteophytic changes. Mild facet hypertrophic changes are noted. Mild neural foraminal narrowing at these levels is seen as well. No acute fracture or acute facet abnormality is noted. Soft tissues and spinal canal: Surrounding soft tissue structures show vascular calcifications. No other focal abnormality is noted. Upper chest: Visualized lung apices are within normal limits. Other: None IMPRESSION: CT of the head: No acute intracranial abnormality noted. CT of the cervical spine: Loss of the normal cervical lordosis related to degenerative change. No acute abnormality is noted. Electronically Signed   By: Inez Catalina M.D.   On: 03/02/2021 19:58    Procedures Procedures   Medications Ordered in ED Medications - No data to display  ED Course  I have reviewed the triage vital signs and the nursing notes.  Pertinent labs & imaging results that were available during my care of the patient were reviewed by me and considered in my medical decision making (see chart for details).    MDM Rules/Calculators/A&P                          MDM  Screen complete  Martin Walker was evaluated in Emergency Department on  03/02/2021 for the symptoms described in the history of present illness. He was evaluated in the context of the global COVID-19 pandemic, which necessitated  consideration that the patient might be at risk for infection with the SARS-CoV-2 virus that causes COVID-19. Institutional protocols and algorithms that pertain to the evaluation of patients at risk for COVID-19 are in a state of rapid change based on information released by regulatory bodies including the CDC and federal and state organizations. These policies and algorithms were followed during the patient's care in the ED.   Patient is presenting for evaluation following reported fall while ice skating  He struck his head.  He did not pass out.  Imaging does not reveal significant traumatic injury.  Patient appears to be appropriate for outpatient management.  Patient understands need for close follow-up.  Strict return precautions given and understood  Final Clinical Impression(s) / ED Diagnoses Final diagnoses:  Fall, initial encounter  Injury of head, initial encounter    Rx / DC Orders ED Discharge Orders    None       Valarie Merino, MD 03/02/21 2015

## 2021-03-02 NOTE — ED Triage Notes (Signed)
Reported he was skating backwards and fell backwards hitting back of his head, -LOC. C/o head and pain.

## 2021-03-11 ENCOUNTER — Encounter: Payer: Self-pay | Admitting: Physician Assistant

## 2021-03-11 ENCOUNTER — Telehealth: Payer: Self-pay | Admitting: Cardiology

## 2021-03-11 NOTE — Telephone Encounter (Signed)
Spoke with patient regarding Thursday 03/25/21 9:30 am appointment at [facility]- with Nicoletta Ba, [place] [employer] phone [phone].  Patient voiced his understanding and information is also in My Chart.

## 2021-03-25 ENCOUNTER — Encounter: Payer: Self-pay | Admitting: Physician Assistant

## 2021-03-25 ENCOUNTER — Other Ambulatory Visit: Payer: Self-pay

## 2021-03-25 ENCOUNTER — Ambulatory Visit: Payer: Federal, State, Local not specified - PPO | Admitting: Physician Assistant

## 2021-03-25 VITALS — BP 120/90 | HR 65 | Ht 67.0 in | Wt 169.0 lb

## 2021-03-25 DIAGNOSIS — K648 Other hemorrhoids: Secondary | ICD-10-CM

## 2021-03-25 DIAGNOSIS — Z860101 Personal history of adenomatous and serrated colon polyps: Secondary | ICD-10-CM | POA: Insufficient documentation

## 2021-03-25 DIAGNOSIS — Z86010 Personal history of colonic polyps: Secondary | ICD-10-CM | POA: Diagnosis not present

## 2021-03-25 MED ORDER — HYDROCORTISONE ACETATE 25 MG RE SUPP: 25.0000 mg | Freq: Every evening | RECTAL | 3 refills | Status: AC | PRN

## 2021-03-25 MED ORDER — NA SULFATE-K SULFATE-MG SULF 17.5-3.13-1.6 GM/177ML PO SOLN: 1.0000 | Freq: Once | ORAL | 0 refills | Status: AC

## 2021-03-25 NOTE — Patient Instructions (Signed)
If you are age 64 or older, your body mass index should be between 23-30. Your Body mass index is 26.47 kg/m. If this is out of the aforementioned range listed, please consider follow up with your Primary Care Provider.  If you are age 24 or younger, your body mass index should be between 19-25. Your Body mass index is 26.47 kg/m. If this is out of the aformentioned range listed, please consider follow up with your Primary Care Provider.   You have been scheduled for a colonoscopy. Please follow written instructions given to you at your visit today.  Please pick up your prep supplies at the pharmacy within the next 1-3 days. If you use inhalers (even only as needed), please bring them with you on the day of your procedure.  START Hydrocortisone suppositories 1 suppository at night as needed for hemorrhoidal symptoms. This has been sent to your pharmacy.  Follow up as needed.  Thank you for entrusting me with your care and choosing [facility].  Amy Esterwood, PA-C

## 2021-03-25 NOTE — Progress Notes (Signed)
Subjective:    Patient ID: Martin Walker, male    DOB: 1956-01-03, 65 y.o.   MRN: [id number]  HPI Martin Walker is a 65 year old white male, established with Dr. Ardis Hughs, who comes into discuss follow-up colonoscopy.  He has also had some intermittent low-grade hematochezia. Patient has history of adenomatous colon polyps and last had colonoscopy in February 2015 for follow-up of a 1.2 cm adenoma found in 2012. In 2015 he was found to have 2 small polyps, 3 to 4 mm in size, and medium sized internal hemorrhoids.  Unfortunately the polyps were lost and no path available.  Decision was made to have 5-year interval follow-up. Patient denies any abdominal pain, changes in bowel habits.  He says he will occasionally see a small amount of bright red blood which he attributes to internal hemorrhoids.  This may occur 2-3 times per year and last for a day or 2.  He is not had any problems with rectal pain pressure or discomfort.  He does not feel that the bleeding has been much of an issue as it occurs infrequently and very sporadically. Most recent labs 01/20/2021 hemoglobin 14.3/hematocrit of 40 MCV 91. Other medical problems include hypertension, asthma and anxiety.    Review of Systems Pertinent positive and negative review of systems were noted in the above HPI section.  All other review of systems was otherwise negative.  Outpatient Encounter Medications as of 03/25/2021  Medication Sig  . ALPRAZolam (XANAX) 0.25 MG tablet TAKE 1 TABLET BY MOUTH EVERY 6 HOURS AS NEEDED FOR  HEART  RACING  . ALREX 0.2 % SUSP Place 2 drops into both eyes as needed. As directed  . aspirin EC 81 MG tablet Take 1 tablet (81 mg total) by mouth daily.  Marland Kitchen atorvastatin (LIPITOR) 20 MG tablet Take 1 tablet (20 mg total) by mouth daily.  . fluticasone (FLONASE) 50 MCG/ACT nasal spray Use 2 spray(s) in each nostril once daily  . hydrochlorothiazide (HYDRODIURIL) 25 MG tablet Take 1 tablet (25 mg total) by mouth daily.  .  hydrocortisone (ANUSOL-HC) 25 MG suppository Place 1 suppository (25 mg total) rectally at bedtime as needed for hemorrhoids or anal itching.  . metoprolol tartrate (LOPRESSOR) 50 MG tablet Take 1 tablet (50 mg total) by mouth 2 (two) times daily.  . Na Sulfate-K Sulfate-Mg Sulf 17.5-3.13-1.6 GM/177ML SOLN Take 1 kit by mouth once for 1 dose. Apply Coupon=BIN: [id number] PCN: CN GROUP: [id number] ID: [id number]; NO prior authorization  . [DISCONTINUED] meloxicam (MOBIC) 15 MG tablet Take 1 tablet (15 mg total) by mouth daily.   Facility-Administered Encounter Medications as of 03/25/2021  Medication  . ondansetron (ZOFRAN) injection 4 mg   Allergies  Allergen Reactions  . Codeine     Causes peeling of the hands   Patient Active Problem List   Diagnosis Date Noted  . Hx of adenomatous colonic polyps 03/25/2021  . Vitamin D deficiency 01/31/2021  . Educated about COVID-19 virus infection 01/01/2020  . Left knee pain 12/30/2019  . HTN (hypertension) 10/01/2018  . Acute gastroenteritis 02/14/2018  . Abdominal pain 02/14/2018  . Hyperglycemia 12/21/2017  . Right shoulder pain 08/29/2017  . Posterior neck pain 08/29/2017  . Influenza 02/02/2017  . Acute non-recurrent maxillary sinusitis 02/02/2017  . Abscess, wrist 07/13/2016  . Gross hematuria 11/08/2012  . Right flank pain 11/08/2012  . 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 Hypertriglyceridemia 10/20/2011  . Hyperlipidemia 10/06/2011  . Preventative health care 10/06/2011  . HEMORRHOIDS 02/04/2011  . Anxiety  state 01/07/2011  . Hemorrhage of rectum and anus 01/07/2011  . Heartburn 01/07/2011  . HEMATOCHEZIA 01/04/2011  . RHINOSINUSITIS, RECURRENT 10/13/2008  . TOBACCO USE DISORDER/SMOKER-SMOKING CESSATION DISCUSSED 07/02/2008  . GOITER 01/09/2008  . ALLERGIC RHINITIS 01/09/2008  . ASTHMA 01/09/2008  . Palpitations 01/09/2008   Social History   Socioeconomic History  . Marital status: Married    Spouse name: Not on file  .  Number of children: Not on file  . Years of education: Not on file  . Highest education level: Not on file  Occupational History  . Occupation: Marine scientist  Tobacco Use  . Smoking status: Former Smoker    Packs/day: 1.50    Years: 35.00    Pack years: 52.50    Types: Cigarettes    Quit date: 04/24/2011    Years since quitting: 9.9  . Smokeless tobacco: Former Systems developer  . Tobacco comment: quit in early 20's  Vaping Use  . Vaping Use: Never used  Substance and Sexual Activity  . Alcohol use: Yes    Comment: 6 beers/ year  . Drug use: Yes    Types: Marijuana    Comment: age 10 - 51 very little  . Sexual activity: Not on file  Other Topics Concern  . 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       Social Determinants of Health   Financial Resource Strain: Not on file  Food Insecurity: Not on file  Transportation Needs: Not on file  Physical Activity: Not on file  Stress: Not on file  Social Connections: Not on file  Intimate Partner Violence: Not on file    Mr. Hedeen family history includes Colon cancer in an other family member; Heart disease in his father; Hypertension in his mother; Lung cancer in an other family member; Other in an other family member; Thyroid disease in his mother.      Objective:    Vitals:   03/25/21 0920  BP: 120/90  Pulse: 65    Physical Exam Well-developed well-nourished older white male in no acute distress.  Height, Weight, BMI 26.4  HEENT; nontraumatic normocephalic, EOMI, PE R LA, sclera anicteric. Oropharynx; not examined today Neck; supple, no JVD Cardiovascular; regular rate and rhythm with S1-S2, no murmur rub or gallop Pulmonary; Clear bilaterally Abdomen; soft, nontender, nondistended, no palpable mass or hepatosplenomegaly, bowel sounds are active Rectal; not done today Skin; benign exam, no jaundice rash or appreciable  lesions Extremities; no clubbing cyanosis or edema skin warm and dry Neuro/Psych; alert and oriented x4, grossly nonfocal mood and affect appropriate       Assessment & Plan:   #85 65 year old white male with history of adenomatous colon polyps, overdue for follow-up colonoscopy, last colon done 02/2014 with removal of two 3 to 4 mm polyps (polyps were lost and therefore no path)  #2 internal hemorrhoids with sporadic small-volume hematochezia occurring a couple of times per year.  Patient does not feel this is been problematic.  #3 hypertension #4.  Asthma #5 recent palpitations-felt possibly related to post Covid per cardiology  Plan; Patient will be scheduled for colonoscopy with Dr. Ardis Hughs.  Procedure was discussed in detail with the patient including indications risk and benefits and he is agreeable to proceed. Have also sent a prescription for Anusol HC suppositories, use for 5 to 7 days nightly, as needed for episodes of hemorrhoidal bleeding.  Giani Betzold S  Mohanad Carsten PA-C 03/25/2021   Cc: Biagio Borg, MD

## 2021-03-26 NOTE — Progress Notes (Signed)
I agree with the above note, plan 

## 2021-04-02 ENCOUNTER — Other Ambulatory Visit: Payer: Self-pay | Admitting: Family

## 2021-04-02 ENCOUNTER — Other Ambulatory Visit: Payer: Self-pay | Admitting: Internal Medicine

## 2021-04-02 DIAGNOSIS — R002 Palpitations: Secondary | ICD-10-CM

## 2021-04-02 NOTE — Telephone Encounter (Signed)
Please refill as per office routine med refill policy (all routine meds refilled for 3 mo or monthly per pt preference up to one year from last visit, then month to month grace period for 3 mo, then further med refills will have to be denied)  

## 2021-04-05 ENCOUNTER — Other Ambulatory Visit: Payer: Self-pay

## 2021-04-06 ENCOUNTER — Encounter: Payer: Self-pay | Admitting: Internal Medicine

## 2021-04-06 ENCOUNTER — Ambulatory Visit: Payer: Federal, State, Local not specified - PPO | Admitting: Internal Medicine

## 2021-04-06 VITALS — BP 126/72 | HR 62 | Temp 97.9°F | Ht 67.0 in | Wt 170.2 lb

## 2021-04-06 DIAGNOSIS — M79671 Pain in right foot: Secondary | ICD-10-CM | POA: Diagnosis not present

## 2021-04-06 DIAGNOSIS — E559 Vitamin D deficiency, unspecified: Secondary | ICD-10-CM | POA: Diagnosis not present

## 2021-04-06 DIAGNOSIS — E7800 Pure hypercholesterolemia, unspecified: Secondary | ICD-10-CM

## 2021-04-06 DIAGNOSIS — R739 Hyperglycemia, unspecified: Secondary | ICD-10-CM

## 2021-04-06 DIAGNOSIS — I10 Essential (primary) hypertension: Secondary | ICD-10-CM

## 2021-04-06 MED ORDER — CELECOXIB 200 MG PO CAPS: 200.0000 mg | ORAL_CAPSULE | Freq: Two times a day (BID) | ORAL | 5 refills | Status: DC | PRN

## 2021-04-06 NOTE — Patient Instructions (Signed)
Please take all new medication as prescribed - the celebrex for pain as needed  Also consider using the OTC voltaren gel as needed  Please hold on any skating for 2 wks, then try the new skates with the new high arch insoles you mentioned  For walking, you may want to check out the [place]  Please see Sports Medicine on the first floor if you are not improving  Another option would be Dr Doran Durand at the [facility] if you are not getting better  Please continue all other medications as before, and refills have been done if requested.  Please have the pharmacy call with any other refills you may need.  Please continue your efforts at being more active, low cholesterol diet, and weight control.  Please keep your appointments with your specialists as you may have planned

## 2021-04-06 NOTE — Progress Notes (Signed)
Patient ID: Martin Walker, male   DOB: [birth year], 65 y.o.   MRN: [id number]        Chief Complaint: right heel pain       HPI:  Martin Walker is a 65 y.o. 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  No falls, trauma, ulcer or redness.  Has tried to change shoes but still hurts.   Not taking VIt D.  Pt denies chest pain, increased sob or doe, wheezing, orthopnea, PND, increased LE swelling, palpitations, dizziness or syncope.   Pt denies polydipsia, polyuria, Denies new focal neuro s/s.   Pt denies fever, wt loss, night sweats, loss of appetite, or other constitutional symptoms       Wt Readings from Last 3 Encounters:  04/06/21 170 lb 3.2 oz (77.2 kg)  03/25/21 169 lb (76.7 kg)  02/19/21 169 lb 9.6 oz (76.9 kg)   BP Readings from Last 3 Encounters:  04/06/21 126/72  03/25/21 120/90  03/02/21 137/89         Past Medical History:  Diagnosis Date  . Asthma    chilhood  . Rhinitis, allergic    skin test pos 11/26/08- dust,cat  . Rhinosinusitis    recurrent   Past Surgical History:  Procedure Laterality Date  . COLONOSCOPY    . left knee    . right wrist    . ROTATOR CUFF REPAIR     left    reports that he quit smoking about 9 years ago. His smoking use included cigarettes. He has a 52.50 pack-year smoking history. He has quit using smokeless tobacco. He reports current alcohol use. He reports current drug use. Drug: Marijuana. family history includes Colon cancer in an other family member; Heart disease in his father; Hypertension in his mother; Lung cancer in an other family member; Other in an  other family member; Thyroid disease in his mother. Allergies  Allergen Reactions  . Codeine     Causes peeling of the hands   Current Outpatient Medications on File Prior to Visit  Medication Sig Dispense Refill  . ALPRAZolam (XANAX) 0.25 MG tablet TAKE 1 TABLET BY MOUTH EVERY 6 HOURS AS NEEDED FOR  HEART  RACING 30 tablet 2  . ALREX 0.2 % SUSP Place 2 drops into both eyes as needed. As directed    . aspirin EC 81 MG tablet Take 1 tablet (81 mg total) by mouth daily. 90 tablet 11  . atorvastatin (LIPITOR) 20 MG tablet Take 1 tablet by mouth once daily 90 tablet 0  . fluticasone (FLONASE) 50 MCG/ACT nasal spray Use 2 spray(s) in each nostril once daily 48 g 0  . hydrochlorothiazide (HYDRODIURIL) 25 MG tablet Take 1 tablet by mouth once daily 90 tablet 0  . hydrocortisone (ANUSOL-HC) 25 MG suppository Place 1 suppository (25 mg total) rectally at bedtime as needed for hemorrhoids or anal itching. 12 suppository 3  . metoprolol tartrate (LOPRESSOR) 50 MG tablet Take 1 tablet by mouth twice daily 180 tablet 0   Current Facility-Administered Medications on File Prior  to Visit  Medication Dose Route Frequency Provider Last Rate Last Admin  . ondansetron (ZOFRAN) injection 4 mg  4 mg Intramuscular Q8H PRN Biagio Borg, MD   4 mg at 02/14/18 1137        ROS:  All others reviewed and negative.  Objective        PE:  BP 126/72 (BP Location: Left Arm, Patient Position: Sitting, Cuff Size: Large)   Pulse 62   Temp 97.9 F (36.6 C) (Oral)   Ht 5\' 7"  (1.702 m)   Wt 170 lb 3.2 oz (77.2 kg)   SpO2 96%   BMI 26.66 kg/m                 Constitutional: Pt appears in NAD               HENT: Head: NCAT.                Right Ear: External ear normal.                 Left Ear: External ear normal.                Eyes: . Pupils are equal, round, and reactive to light. Conjunctivae and EOM are normal               Nose: without d/c or deformity               Neck: Neck supple. Gross normal ROM                Cardiovascular: Normal rate and regular rhythm.                 Pulmonary/Chest: Effort normal and breath sounds without rales or wheezing.                Abd:  Soft, NT, ND, + BS, no organomegaly               Neurological: Pt is alert. At baseline orientation, motor grossly intact               Skin: Skin is warm. No rashes, no other new lesions, LE edema - none, right heel with mod tender no ulcer or redness               Psychiatric: Pt behavior is normal without agitation   Micro: none  Cardiac tracings I have personally interpreted today:  none  Pertinent Radiological findings (summarize): none   Lab Results  Component Value Date   WBC 8.8 01/20/2021   HGB 14.3 01/20/2021   HCT 41.5 01/20/2021   PLT 272.0 01/20/2021   GLUCOSE 92 01/20/2021   CHOL 132 01/20/2021   TRIG 101.0 01/20/2021   HDL 33.50 (L) 01/20/2021   LDLDIRECT 167.4 12/11/2013   LDLCALC 78 01/20/2021   ALT 38 01/20/2021   AST 23 01/20/2021   NA 142 01/20/2021   K 4.6 01/20/2021   CL 106 01/20/2021   CREATININE 1.03 01/20/2021   BUN 19 01/20/2021   CO2 27 01/20/2021   TSH 1.88 01/20/2021   PSA 1.89 01/20/2021   HGBA1C 5.8 01/20/2021   MICROALBUR 1.0 12/14/2016   Assessment/Plan:  Martin Walker is a 65 y.o. White or Caucasian [1] male with  has a past medical history of Asthma, Rhinitis, allergic, and Rhinosinusitis.  Pain of right heel C/w plantar fasciitis - for shoe inserts or heel cushionis, excercises, soft soled shoes, celebrex bid  prn, and volt gel prn otc, and refer sports medicine for possible cortisone if not improved  Vitamin D deficiency Last vitamin D Lab Results  Component Value Date   VD25OH 25.02 (L) 01/20/2021   Low, to start oral replacement   Hyperlipidemia Lab Results  Component Value Date   LDLCALC 78 01/20/2021   Stable, pt to continue current statin lipitor 20, declines increase for now, to work more on lower chol diet   Hyperglycemia Lab Results   Component Value Date   HGBA1C 5.8 01/20/2021   Stable, pt to continue current medical treatment  - diet   HTN (hypertension) BP Readings from Last 3 Encounters:  04/06/21 126/72  03/25/21 120/90  03/02/21 137/89   Stable, pt to continue medical treatment lopressor   Followup: Return if symptoms worsen or fail to improve.  Cathlean Cower, MD 04/11/2021 4:04 AM [facility]

## 2021-04-11 ENCOUNTER — Encounter: Payer: Self-pay | Admitting: Internal Medicine

## 2021-04-11 NOTE — Assessment & Plan Note (Signed)
Lab Results  Component Value Date   HGBA1C 5.8 01/20/2021   Stable, pt to continue current medical treatment  - diet

## 2021-04-11 NOTE — Assessment & Plan Note (Signed)
Lab Results  Component Value Date   LDLCALC 78 01/20/2021   Stable, pt to continue current statin lipitor 20, declines increase for now, to work more on lower chol diet

## 2021-04-11 NOTE — Assessment & Plan Note (Signed)
BP Readings from Last 3 Encounters:  04/06/21 126/72  03/25/21 120/90  03/02/21 137/89   Stable, pt to continue medical treatment lopressor

## 2021-04-11 NOTE — Assessment & Plan Note (Signed)
C/w plantar fasciitis - for shoe inserts or heel cushionis, excercises, soft soled shoes, celebrex bid prn, and volt gel prn otc, and refer sports medicine for possible cortisone if not improved

## 2021-04-11 NOTE — Assessment & Plan Note (Signed)
Last vitamin D Lab Results  Component Value Date   VD25OH 25.02 (L) 01/20/2021   Low, to start oral replacement

## 2021-04-19 ENCOUNTER — Encounter: Payer: Federal, State, Local not specified - PPO | Admitting: Gastroenterology

## 2021-04-22 ENCOUNTER — Ambulatory Visit: Payer: Self-pay

## 2021-04-22 ENCOUNTER — Encounter: Payer: Self-pay | Admitting: Family Medicine

## 2021-04-22 ENCOUNTER — Other Ambulatory Visit: Payer: Self-pay

## 2021-04-22 ENCOUNTER — Ambulatory Visit: Payer: Federal, State, Local not specified - PPO | Admitting: Family Medicine

## 2021-04-22 VITALS — BP 132/92 | HR 59 | Ht 67.0 in | Wt 173.4 lb

## 2021-04-22 DIAGNOSIS — M79671 Pain in right foot: Secondary | ICD-10-CM | POA: Diagnosis not present

## 2021-04-22 DIAGNOSIS — M66322 Spontaneous rupture of flexor tendons, left upper arm: Secondary | ICD-10-CM | POA: Diagnosis not present

## 2021-04-22 NOTE — Patient Instructions (Addendum)
Thank you for coming in today.  Please use Voltaren gel (Generic Diclofenac Gel) up to 4x daily for pain as needed.  This is available over-the-counter as both the name brand Voltaren gel and the generic diclofenac gel.  Ice.   Please go to [facility] supply to get the cam walker boot we talked about today. You may also be able to get it from [employer].   Recheck in about 3 weeks.   Please complete the exercises that the athletic trainer went over with you: View at [email or web address] using code: [email or web address]

## 2021-04-22 NOTE — Progress Notes (Signed)
I, Peterson Lombard, LAT, ATC acting as a scribe for Lynne Leader, MD.  Subjective:    I'm seeing this patient as a consultation for: Dr. Cathlean Cower. Note will be routed back to referring provider/PCP.  CC: Right heel pain  HPI: Pt is a 65 y/o male c/o R heel pain ongoing since early April. MOI: Pt increased activity, power walking, to 3-6 miles per day. Pt also returned to playing hockey and was not used to the new skates. Pt locates pain to the medial aspect of his heel. Pt reports he has "high arches" and pushes off the inside of his foot when skating.  Aggravates: stepping down first thing in the morning, increased walking mileage, standing for extended periods Treatments tried: celebrex, Voltaren gel, ice, rest  Pt also c/o L biceps dyssymmetry.  Pt has a hx of RC surgery. Pt c/o a symmetry different between R vs L biceps. Pt is R-hand dominate. Pt is wanting to get back into lifting weights.  Past medical history, Surgical history, Family history, Social history, Allergies, and medications have been entered into the medical record, reviewed. History left shoulder surgery 2012  Review of Systems: No new headache, visual changes, nausea, vomiting, diarrhea, constipation, dizziness, abdominal pain, skin rash, fevers, chills, night sweats, weight loss, swollen lymph nodes, body aches, joint swelling, muscle aches, chest pain, shortness of breath, mood changes, visual or auditory hallucinations.   Objective:    Vitals:   04/22/21 1434  BP: (!) 132/92  Pulse: (!) 59  SpO2: 96%   General: Well Developed, well nourished, and in no acute distress.  Neuro/Psych: Alert and oriented x3, extra-ocular muscles intact, able to move all 4 extremities, sensation grossly intact. Skin: Warm and dry, no rashes noted.  Respiratory: Not using accessory muscles, speaking in full sentences, trachea midline.  Cardiovascular: Pulses palpable, no extremity edema. Abdomen: Does not appear distended. MSK:  Right foot slightly erythematous overlying medial plantar calcaneus.  Very tender to palpation in this area.  Normal foot and ankle motion.  Pain with weightbearing.  Normal strength otherwise.  Stable ligamentous exam.  Left upper arm slight Popeye arm deformity.  Nontender normal strength.  Normal negative hook test at distal biceps tendon without any tenderness.  Intact strength.  Negative Yergason's and speeds test.  Lab and Radiology Results  Diagnostic Limited MSK Ultrasound of: Right heel and left shoulder Right calcaneus: Plantar fascial visualized thickened and irritated appearing.  Calcaneus normal-appearing no other significant abnormalities in area of pen tenderness. Distal biceps and biceps tendon normal. Proximal biceps tendon long head not visible and bicipital groove. Impression: Planter fasciitis and presumed proximal biceps long head tendon tear   Impression and Recommendations:    Assessment and Plan: 65 y.o. male with right heel pain.  Patient started using new ice skates and developed immediate severe canal relatively persistent heel pain.  It is pretty obvious to me that the cause of his pain is the new skates.  I recommend that he return him as he is still within his 90-day window of time to return these expensive knee skates and return to skates that we know are going to be comfortable. .  Plan to treat with cam walker boot eccentric exercises and Voltaren gel.  Recheck in 3 weeks.  If not improved consider injection.  As for the appearance of the left arm I think Juaquin has had a left distal biceps long head tear at some point in the past.  He has a mild  Popeye arm deformity.  He is more concerned about the risk of having a distal biceps tendon tear which I think is very unlikely.  Recommend continued activity as tolerated and recheck as needed.  PDMP not reviewed this encounter. Orders Placed This Encounter  Procedures  . [place] LIMITED JOINT SPACE STRUCTURES LOW  RIGHT(NO LINKED CHARGES)    Standing Status:   Future    Number of Occurrences:   1    Standing Expiration Date:   10/22/2021    Order Specific Question:   Reason for Exam (SYMPTOM  OR DIAGNOSIS REQUIRED)    Answer:   right heel pain    Order Specific Question:   Preferred imaging location?    Answer:   [facility]   No orders of the defined types were placed in this encounter.   Discussed warning signs or symptoms. Please see discharge instructions. Patient expresses understanding.   The above documentation has been reviewed and is accurate and complete Lynne Leader, M.D.

## 2021-05-12 NOTE — Progress Notes (Signed)
   I, Wendy Poet, LAT, ATC, am serving as scribe for Dr. Lynne Leader.  Martin Walker is a 65 y.o. male who presents to [place] at [facility] today for f/u R heel pain ongoing since early April 2022. Pt was last seen by Dr. Georgina Snell on 04/22/21 and was advised to treat w/ eccentric exercises, CAM walker boot, and Voltaren gel. Since his last visit, pt reports that his R heel is feeling better and rates his improvement at nearly 100%.  He has been wearing his boot fairly consistently including wearing it at night while sleeping.  He brought some inserts for his hockey skates that he would like Dr. Georgina Snell to look at.   Pertinent review of systems: No fevers or chills  Relevant historical information: Hypertension   Exam:  BP (!) 158/84 (BP Location: Right Arm, Patient Position: Sitting, Cuff Size: Normal)   Pulse 72   Ht 5\' 7"  (1.702 m)   Wt 174 lb (78.9 kg)   SpO2 97%   BMI 27.25 kg/m  General: Well Developed, well nourished, and in no acute distress.   MSK: Right heel normal-appearing nontender normal motion normal strength.     Assessment and Plan: 65 y.o. male with significant improvement in pain right heel with rest with cam walker boot.  Plan to transition out of cam walker boot and start normal activities.  Patient has returned to his poorly fitting ice skates.  The new custom ice skates will arrive in about 2 months.  By that time he should have been able to advance his activity thoroughly and be ready to ice-skating again.  Continue to work on eccentric exercises for Achilles tendon/plantar fascia and will add exercises for posterior tibialis tendon as well.  Recommend recheck in about 2 months which should be just before he starts ice-skating again.    Discussed warning signs or symptoms. Please see discharge instructions. Patient expresses understanding.   The above documentation has been reviewed and is accurate and complete Lynne Leader,  M.D.   Total encounter time 20 minutes including face-to-face time with the patient and, reviewing past medical record, and charting on the date of service.   Discussion of treatment plan

## 2021-05-13 ENCOUNTER — Other Ambulatory Visit: Payer: Self-pay

## 2021-05-13 ENCOUNTER — Encounter: Payer: Self-pay | Admitting: Family Medicine

## 2021-05-13 ENCOUNTER — Ambulatory Visit (INDEPENDENT_AMBULATORY_CARE_PROVIDER_SITE_OTHER): Payer: Federal, State, Local not specified - PPO | Admitting: Family Medicine

## 2021-05-13 VITALS — BP 158/84 | HR 72 | Ht 67.0 in | Wt 174.0 lb

## 2021-05-13 DIAGNOSIS — M79671 Pain in right foot: Secondary | ICD-10-CM | POA: Diagnosis not present

## 2021-05-13 NOTE — Patient Instructions (Addendum)
Thank you for coming in today.  Continue the exercises.  We will add some additional exercises.   Please perform the exercise program that we have prepared for you and gone over in detail on a daily basis.  In addition to the handout you were provided you can access your program through: [email or web address]   Your unique program code is: [email or web address]   STOP the boot and advance walking activity as tolerated.   If not doing ok let me know and I will refer to PT.   Recheck with me in 2 months

## 2021-06-08 DIAGNOSIS — D1722 Benign lipomatous neoplasm of skin and subcutaneous tissue of left arm: Secondary | ICD-10-CM | POA: Diagnosis not present

## 2021-06-08 DIAGNOSIS — L821 Other seborrheic keratosis: Secondary | ICD-10-CM | POA: Diagnosis not present

## 2021-06-08 DIAGNOSIS — L720 Epidermal cyst: Secondary | ICD-10-CM | POA: Diagnosis not present

## 2021-06-08 DIAGNOSIS — L570 Actinic keratosis: Secondary | ICD-10-CM | POA: Diagnosis not present

## 2021-06-08 DIAGNOSIS — C44519 Basal cell carcinoma of skin of other part of trunk: Secondary | ICD-10-CM | POA: Diagnosis not present

## 2021-06-08 DIAGNOSIS — Z85828 Personal history of other malignant neoplasm of skin: Secondary | ICD-10-CM | POA: Diagnosis not present

## 2021-06-22 ENCOUNTER — Encounter: Payer: Federal, State, Local not specified - PPO | Admitting: Gastroenterology

## 2021-06-22 NOTE — Telephone Encounter (Signed)
error 

## 2021-07-03 ENCOUNTER — Other Ambulatory Visit: Payer: Self-pay | Admitting: Internal Medicine

## 2021-07-04 NOTE — Telephone Encounter (Signed)
Please refill as per office routine med refill policy (all routine meds refilled for 3 mo or monthly per pt preference up to one year from last visit, then month to month grace period for 3 mo, then further med refills will have to be denied)  

## 2021-07-07 ENCOUNTER — Ambulatory Visit: Payer: Federal, State, Local not specified - PPO | Attending: Internal Medicine

## 2021-07-07 ENCOUNTER — Other Ambulatory Visit (HOSPITAL_BASED_OUTPATIENT_CLINIC_OR_DEPARTMENT_OTHER): Payer: Self-pay

## 2021-07-07 ENCOUNTER — Other Ambulatory Visit: Payer: Self-pay

## 2021-07-07 DIAGNOSIS — Z23 Encounter for immunization: Secondary | ICD-10-CM

## 2021-07-07 MED ORDER — PFIZER-BIONT COVID-19 VAC-TRIS 30 MCG/0.3ML IM SUSP
INTRAMUSCULAR | 0 refills | Status: DC
  Filled 2021-07-07: qty 0.3, 1d supply, fill #0

## 2021-07-07 NOTE — Progress Notes (Signed)
   Covid-19 Vaccination Clinic  Name:  Martin Walker    MRN: [id number] DOB: 07-Jun-1956  07/07/2021  [name] was observed post Covid-19 immunization for 15 minutes without incident. He was provided with Vaccine Information Sheet and instruction to access the V-Safe system.   [name] was instructed to call 911 with any severe reactions post vaccine: Difficulty breathing  Swelling of face and throat  A fast heartbeat  A bad rash all over body  Dizziness and weakness   Immunizations Administered     Name Date Dose VIS Date Route   PFIZER Comrnaty(Gray TOP) Covid-19 Vaccine 07/07/2021  1:57 PM 0.3 mL 12/03/2020 Intramuscular   Manufacturer: [place]   Lot: [id number]   [place]: [phone]

## 2021-07-13 ENCOUNTER — Ambulatory Visit: Payer: Federal, State, Local not specified - PPO | Admitting: Family Medicine

## 2021-07-14 DIAGNOSIS — C44519 Basal cell carcinoma of skin of other part of trunk: Secondary | ICD-10-CM | POA: Diagnosis not present

## 2021-09-01 ENCOUNTER — Ambulatory Visit (AMBULATORY_SURGERY_CENTER): Payer: Federal, State, Local not specified - PPO

## 2021-09-01 ENCOUNTER — Other Ambulatory Visit: Payer: Self-pay

## 2021-09-01 VITALS — Ht 67.0 in | Wt 175.0 lb

## 2021-09-01 DIAGNOSIS — Z86010 Personal history of colonic polyps: Secondary | ICD-10-CM

## 2021-09-01 NOTE — Progress Notes (Signed)
    Patient's pre-visit was done today over the phone with the patient   Name,DOB and address verified.   Patient denies any allergies to Eggs and Soy.  Patient denies any problems with anesthesia/sedation. Patient denies taking diet pills or blood thinners.  Denies atrial flutter or atrial fib Denies chronic constipation No home Oxygen.   Packet of Prep instructions mailed to patient including a copy of a consent form-pt is aware.  Patient understands to call us back with any questions or concerns.  Patient is aware of our care-partner policy and 0000000 safety protocol.   EMMI education assigned to the patient for the procedure, sent to [place].   The patient is COVID-19 vaccinated.       Pt states he has suprep  on hand.  Prep instructions changed to reflect what he has.  Daughter tested positive for COVID 6 days ago.  Pt is vaccinated/boosted and is asymptomatic.  Discussed letting us know if he becomes symptomatic.

## 2021-09-15 ENCOUNTER — Encounter: Payer: Self-pay | Admitting: Gastroenterology

## 2021-09-15 ENCOUNTER — Other Ambulatory Visit: Payer: Self-pay

## 2021-09-15 ENCOUNTER — Ambulatory Visit (AMBULATORY_SURGERY_CENTER): Payer: Federal, State, Local not specified - PPO | Admitting: Gastroenterology

## 2021-09-15 VITALS — BP 126/81 | HR 51 | Temp 97.7°F | Resp 14 | Ht 67.0 in | Wt 175.0 lb

## 2021-09-15 DIAGNOSIS — Z1211 Encounter for screening for malignant neoplasm of colon: Secondary | ICD-10-CM | POA: Diagnosis not present

## 2021-09-15 DIAGNOSIS — Z86010 Personal history of colonic polyps: Secondary | ICD-10-CM | POA: Diagnosis not present

## 2021-09-15 MED ORDER — SODIUM CHLORIDE 0.9 % IV SOLN: 500.0000 mL | Freq: Once | INTRAVENOUS | Status: DC

## 2021-09-15 NOTE — Progress Notes (Signed)
HPI: This is a   colonoscopy i2012 single 1.2cm TA removed, Colonoscopy February 2015 found to have 2 small polyps, 3 to 4 mm in size, and medium sized internal hemorrhoids.  Unfortunately the polyps were lost and no path available.  ROS: complete GI ROS as described in HPI, all other review negative.  Constitutional:  No unintentional weight loss   Past Medical History:  Diagnosis Date   Allergy    seasonal allerties   Arthritis    Asthma    chilhood   Hyperlipidemia    Hypertension    Irregular heart beat    on medication.   Rhinitis, allergic    skin test pos 11/26/08- dust,cat   Rhinosinusitis    recurrent    Past Surgical History:  Procedure Laterality Date   COLONOSCOPY     left knee     right wrist     ROTATOR CUFF REPAIR     left    Current Outpatient Medications  Medication Sig Dispense Refill   ALREX 0.2 % SUSP Place 2 drops into both eyes as needed. As directed     aspirin EC 81 MG tablet Take 1 tablet (81 mg total) by mouth daily. 90 tablet 11   atorvastatin (LIPITOR) 20 MG tablet Take 1 tablet by mouth once daily 90 tablet 0   fluticasone (FLONASE) 50 MCG/ACT nasal spray Use 2 spray(s) in each nostril once daily 48 g 0   hydrochlorothiazide (HYDRODIURIL) 25 MG tablet Take 1 tablet by mouth once daily 90 tablet 0   metoprolol tartrate (LOPRESSOR) 50 MG tablet Take 1 tablet by mouth twice daily 180 tablet 0   raNITIdine HCl (ZANTAC PO) Take by mouth.     VITAMIN D PO Take by mouth.     ALPRAZolam (XANAX) 0.25 MG tablet TAKE 1 TABLET BY MOUTH EVERY 6 HOURS AS NEEDED FOR  HEART  RACING 30 tablet 2   celecoxib (CELEBREX) 200 MG capsule Take 1 capsule (200 mg total) by mouth 2 (two) times daily as needed. (Patient not taking: No sig reported) 60 capsule 5   fluorouracil (EFUDEX) 5 % cream Apply topically 2 (two) times daily.     hydrocortisone (ANUSOL-HC) 25 MG suppository Place 1 suppository (25 mg total) rectally at bedtime as needed for hemorrhoids or anal  itching. 12 suppository 3   hydrocortisone 2.5 % cream Apply topically 2 (two) times a week.     ketoconazole (NIZORAL) 2 % cream Apply topically.     Current Facility-Administered Medications  Medication Dose Route Frequency Provider Last Rate Last Admin   0.9 %  sodium chloride infusion  500 mL Intravenous Once Milus Banister, MD       ondansetron [facility]) injection 4 mg  4 mg Intramuscular Q8H PRN Biagio Borg, MD   4 mg at 02/14/18 1137    Allergies as of 09/15/2021 - Review Complete 09/15/2021  Allergen Reaction Noted   Codeine  12/24/2013    Family History  Problem Relation Age of Onset   Hypertension Mother    Thyroid disease Mother    Heart disease Father    Lung cancer Other        GRANDFATHER   Colon cancer Other        UNCLE   Other Other        GRANDMOTHER WITH BRAIN TUMOR   Colon polyps Neg Hx    Esophageal cancer Neg Hx    Rectal cancer Neg Hx    Stomach cancer  Neg Hx     Social History   Socioeconomic History   Marital status: Married    Spouse name: Not on file   Number of children: Not on file   Years of education: Not on file   Highest education level: Not on file  Occupational History   Occupation: Post [employer]  Tobacco Use   Smoking status: Former    Packs/day: 1.50    Years: 35.00    Pack years: 52.50    Types: Cigarettes    Quit date: 04/24/2011    Years since quitting: 10.4   Smokeless tobacco: Former   Tobacco comments:    quit in early 20's  Vaping Use   Vaping Use: Never used  Substance and Sexual Activity   Alcohol use: Yes    Comment: 6 beers/ year   Drug use: Not Currently    Types: Marijuana    Comment: age 76 - 89 very little   Sexual activity: Not on file  Other Topics Concern   Not on file  Social History Narrative   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       Social Determinants of Health   Financial Resource Strain: Not on file  Food Insecurity:  Not on file  Transportation Needs: Not on file  Physical Activity: Not on file  Stress: Not on file  Social Connections: Not on file  Intimate Partner Violence: Not on file     Physical Exam: BP 117/74   Pulse (!) 53   Temp 97.7 F (36.5 C) (Temporal)   Ht 5\' 7"  (1.702 m)   Wt 175 lb (79.4 kg)   SpO2 98%   BMI 27.41 kg/m  Constitutional: generally well-appearing Psychiatric: alert and oriented x3 Lungs: CTA bilaterally Heart: no MCR  Assessment and plan: 65 y.o. male with h/o adneomatous polyps  Colonsocoyp today  Care is appropriate for the ambulatory setting.  Owens Loffler, MD [facility] 09/15/2021, 9:30 AM

## 2021-09-15 NOTE — Progress Notes (Signed)
PT taken to PACU. Monitors in place. VSS. Report given to RN. 

## 2021-09-15 NOTE — Op Note (Signed)
[facility] Patient Name: Martin Walker Procedure Date: 09/15/2021 9:29 AM MRN: [id number] Endoscopist: Milus Banister , MD Age: 65 Referring MD:  Date of Birth: [birth year] Gender: Male Account #: [id number] Procedure:                Colonoscopy Indications:              High risk colon cancer surveillance: Personal                            history of colonic polyps; colonoscopy 2012 single                            1.2cm TA removed, Colonoscopy February 2015 found                            to have 2 small polyps, 3 to 4 mm in size, and                            medium sized internal hemorrhoids. Unfortunately                            the polyps were lost and no path available. Medicines:                Monitored Anesthesia Care Procedure:                Pre-Anesthesia Assessment:                           - Prior to the procedure, a History and Physical                            was performed, and patient medications and                            allergies were reviewed. The patient's tolerance of                            previous anesthesia was also reviewed. The risks                            and benefits of the procedure and the sedation                            options and risks were discussed with the patient.                            All questions were answered, and informed consent                            was obtained. Prior Anticoagulants: The patient has                            taken no previous anticoagulant or antiplatelet  agents. ASA Grade Assessment: II - A patient with                            mild systemic disease. After reviewing the risks                            and benefits, the patient was deemed in                            satisfactory condition to undergo the procedure.                           After obtaining informed consent, the colonoscope                            was passed under direct  vision. Throughout the                            procedure, the patient's blood pressure, pulse, and                            oxygen saturations were monitored continuously. The                            Olympus CF-HQ190L ([id number]) Colonoscope was                            introduced through the anus and advanced to the the                            cecum, identified by appendiceal orifice and                            ileocecal valve. The colonoscopy was performed                            without difficulty. The patient tolerated the                            procedure well. The quality of the bowel                            preparation was good. The ileocecal valve,                            appendiceal orifice, and rectum were photographed. Scope In: 9:41:23 AM Scope Out: 9:51:11 AM Scope Withdrawal Time: 0 hours 6 minutes 48 seconds  Total Procedure Duration: 0 hours 9 minutes 48 seconds  Findings:                 External and internal hemorrhoids were found. The                            hemorrhoids were medium-sized.  The exam was otherwise without abnormality on                            direct and retroflexion views. Complications:            No immediate complications. Estimated blood loss:                            None. Estimated Blood Loss:     Estimated blood loss: none. Impression:               - External and internal hemorrhoids.                           - The examination was otherwise normal on direct                            and retroflexion views.                           - No polyps or cancers. Recommendation:           - Patient has a contact number available for                            emergencies. The signs and symptoms of potential                            delayed complications were discussed with the                            patient. Return to normal activities tomorrow.                            Written  discharge instructions were provided to the                            patient.                           - Resume previous diet.                           - Continue present medications.                           - Repeat colonoscopy in 7 years for surveillance. Milus Banister, MD 09/15/2021 9:54:12 AM This report has been signed electronically.

## 2021-09-15 NOTE — Patient Instructions (Signed)
Thank you for letting us take care of your healthcare needs today. Please see handouts given to you on Hemorrhoids.    YOU HAD AN ENDOSCOPIC PROCEDURE TODAY AT [facility]:   Refer to the procedure report that was given to you for any specific questions about what was found during the examination.  If the procedure report does not answer your questions, please call your gastroenterologist to clarify.  If you requested that your care partner not be given the details of your procedure findings, then the procedure report has been included in a sealed envelope for you to review at your convenience later.  YOU SHOULD EXPECT: Some feelings of bloating in the abdomen. Passage of more gas than usual.  Walking can help get rid of the air that was put into your GI tract during the procedure and reduce the bloating. If you had a lower endoscopy (such as a colonoscopy or flexible sigmoidoscopy) you may notice spotting of blood in your stool or on the toilet paper. If you underwent a bowel prep for your procedure, you may not have a normal bowel movement for a few days.  Please Note:  You might notice some irritation and congestion in your nose or some drainage.  This is from the oxygen used during your procedure.  There is no need for concern and it should clear up in a day or so.  SYMPTOMS TO REPORT IMMEDIATELY:  Following lower endoscopy (colonoscopy or flexible sigmoidoscopy):  Excessive amounts of blood in the stool  Significant tenderness or worsening of abdominal pains  Swelling of the abdomen that is new, acute  Fever of 100F or higher  For urgent or emergent issues, a gastroenterologist can be reached at any hour by calling [phone]. Do not use MyChart messaging for urgent concerns.    DIET:  We do recommend a small meal at first, but then you may proceed to your regular diet.  Drink plenty of fluids but you should avoid alcoholic beverages for 24 hours.  ACTIVITY:  You  should plan to take it easy for the rest of today and you should NOT DRIVE or use heavy machinery until tomorrow (because of the sedation medicines used during the test).    FOLLOW UP: Our staff will call the number listed on your records 48-72 hours following your procedure to check on you and address any questions or concerns that you may have regarding the information given to you following your procedure. If we do not reach you, we will leave a message.  We will attempt to reach you two times.  During this call, we will ask if you have developed any symptoms of COVID 19. If you develop any symptoms (ie: fever, flu-like symptoms, shortness of breath, cough etc.) before then, please call [phone].  If you test positive for Covid 19 in the 2 weeks post procedure, please call and report this information to [place].    If any biopsies were taken you will be contacted by phone or by letter within the next 1-3 weeks.  Please call us at [phone] if you have not heard about the biopsies in 3 weeks.    SIGNATURES/CONFIDENTIALITY: You and/or your care partner have signed paperwork which will be entered into your electronic medical record.  These signatures attest to the fact that that the information above on your After Visit Summary has been reviewed and is understood.  Full responsibility of the confidentiality of this discharge information lies with you  and/or your care-partner.

## 2021-09-15 NOTE — Progress Notes (Signed)
VS-CW  Pt's states no medical or surgical changes since previsit or office visit.  

## 2021-09-17 ENCOUNTER — Telehealth: Payer: Self-pay | Admitting: *Deleted

## 2021-09-17 NOTE — Telephone Encounter (Signed)
  Follow up Call-  Call back number 09/15/2021  Post procedure Call Back phone  # [phone]  Permission to leave phone message Yes  Some recent data might be hidden     Patient questions:  Do you have a fever, pain , or abdominal swelling? No. Pain Score  0 *  Have you tolerated food without any problems? Yes.    Have you been able to return to your normal activities? Yes.    Do you have any questions about your discharge instructions: Diet   No. Medications  No. Follow up visit  No.  Do you have questions or concerns about your Care? No.  Actions: * If pain score is 4 or above: No action needed, pain <4.  Have you developed a fever since your procedure? no  2.   Have you had an respiratory symptoms (SOB or cough) since your procedure? no  3.   Have you tested positive for COVID 19 since your procedure no  4.   Have you had any family members/close contacts diagnosed with the COVID 19 since your procedure?  no   If yes to any of these questions please route to Joylene John, RN and Joella Prince, RN

## 2021-09-22 ENCOUNTER — Ambulatory Visit (INDEPENDENT_AMBULATORY_CARE_PROVIDER_SITE_OTHER): Payer: Federal, State, Local not specified - PPO

## 2021-09-22 DIAGNOSIS — Z85828 Personal history of other malignant neoplasm of skin: Secondary | ICD-10-CM | POA: Diagnosis not present

## 2021-09-22 DIAGNOSIS — L820 Inflamed seborrheic keratosis: Secondary | ICD-10-CM | POA: Diagnosis not present

## 2021-09-22 DIAGNOSIS — L720 Epidermal cyst: Secondary | ICD-10-CM | POA: Diagnosis not present

## 2021-09-22 DIAGNOSIS — Z23 Encounter for immunization: Secondary | ICD-10-CM | POA: Diagnosis not present

## 2021-09-22 DIAGNOSIS — L821 Other seborrheic keratosis: Secondary | ICD-10-CM | POA: Diagnosis not present

## 2021-10-01 ENCOUNTER — Other Ambulatory Visit: Payer: Self-pay | Admitting: Internal Medicine

## 2022-01-27 ENCOUNTER — Other Ambulatory Visit (INDEPENDENT_AMBULATORY_CARE_PROVIDER_SITE_OTHER): Payer: Federal, State, Local not specified - PPO

## 2022-01-27 ENCOUNTER — Other Ambulatory Visit: Payer: Self-pay

## 2022-01-27 DIAGNOSIS — E559 Vitamin D deficiency, unspecified: Secondary | ICD-10-CM | POA: Diagnosis not present

## 2022-01-27 DIAGNOSIS — Z125 Encounter for screening for malignant neoplasm of prostate: Secondary | ICD-10-CM

## 2022-01-27 DIAGNOSIS — I10 Essential (primary) hypertension: Secondary | ICD-10-CM | POA: Diagnosis not present

## 2022-01-27 DIAGNOSIS — Z0000 Encounter for general adult medical examination without abnormal findings: Secondary | ICD-10-CM

## 2022-01-27 LAB — VITAMIN D 25 HYDROXY (VIT D DEFICIENCY, FRACTURES): VITD: 58.26 ng/mL (ref 30.00–100.00)

## 2022-01-27 LAB — CBC WITH DIFFERENTIAL/PLATELET
Basophils Absolute: 0.1 10*3/uL (ref 0.0–0.1)
Basophils Relative: 0.8 % (ref 0.0–3.0)
Eosinophils Absolute: 0.3 10*3/uL (ref 0.0–0.7)
Eosinophils Relative: 4 % (ref 0.0–5.0)
HCT: 43.3 % (ref 39.0–52.0)
Hemoglobin: 14.5 g/dL (ref 13.0–17.0)
Lymphocytes Relative: 36.4 % (ref 12.0–46.0)
Lymphs Abs: 2.9 10*3/uL (ref 0.7–4.0)
MCHC: 33.4 g/dL (ref 30.0–36.0)
MCV: 91.8 fl (ref 78.0–100.0)
Monocytes Absolute: 0.8 10*3/uL (ref 0.1–1.0)
Monocytes Relative: 9.7 % (ref 3.0–12.0)
Neutro Abs: 3.8 10*3/uL (ref 1.4–7.7)
Neutrophils Relative %: 49.1 % (ref 43.0–77.0)
Platelets: 263 10*3/uL (ref 150.0–400.0)
RBC: 4.72 Mil/uL (ref 4.22–5.81)
RDW: 12.8 % (ref 11.5–15.5)
WBC: 7.8 10*3/uL (ref 4.0–10.5)

## 2022-01-27 LAB — HEPATIC FUNCTION PANEL
ALT: 23 U/L (ref 0–53)
AST: 27 U/L (ref 0–37)
Albumin: 4.6 g/dL (ref 3.5–5.2)
Alkaline Phosphatase: 79 U/L (ref 39–117)
Bilirubin, Direct: 0.3 mg/dL (ref 0.0–0.3)
Total Bilirubin: 1.4 mg/dL — ABNORMAL HIGH (ref 0.2–1.2)
Total Protein: 7.6 g/dL (ref 6.0–8.3)

## 2022-01-27 LAB — LIPID PANEL
Cholesterol: 133 mg/dL (ref 0–200)
HDL: 40.3 mg/dL (ref >39.00)
LDL Cholesterol: 71 mg/dL (ref 0–99)
NonHDL: 92.89
Total CHOL/HDL Ratio: 3
Triglycerides: 110 mg/dL (ref 0.0–149.0)
VLDL: 22 mg/dL (ref 0.0–40.0)

## 2022-01-27 LAB — URINALYSIS, ROUTINE W REFLEX MICROSCOPIC
Bilirubin Urine: NEGATIVE
Ketones, ur: NEGATIVE
Leukocytes,Ua: NEGATIVE
Nitrite: NEGATIVE
Specific Gravity, Urine: 1.02 (ref 1.000–1.030)
Total Protein, Urine: NEGATIVE
Urine Glucose: NEGATIVE
Urobilinogen, UA: 0.2 (ref 0.0–1.0)
pH: 6 (ref 5.0–8.0)

## 2022-01-27 LAB — BASIC METABOLIC PANEL
BUN: 19 mg/dL (ref 6–23)
CO2: 31 mEq/L (ref 19–32)
Calcium: 10.2 mg/dL (ref 8.4–10.5)
Chloride: 100 mEq/L (ref 96–112)
Creatinine, Ser: 1.24 mg/dL (ref 0.40–1.50)
GFR: 61.29 mL/min (ref >60.00)
Glucose, Bld: 88 mg/dL (ref 70–99)
Potassium: 4.4 mEq/L (ref 3.5–5.1)
Sodium: 140 mEq/L (ref 135–145)

## 2022-01-27 LAB — PSA: PSA: 2.37 ng/mL (ref 0.10–4.00)

## 2022-01-27 LAB — TSH: TSH: 1.49 u[IU]/mL (ref 0.35–5.50)

## 2022-01-31 ENCOUNTER — Ambulatory Visit (INDEPENDENT_AMBULATORY_CARE_PROVIDER_SITE_OTHER): Payer: Federal, State, Local not specified - PPO | Admitting: Internal Medicine

## 2022-01-31 ENCOUNTER — Other Ambulatory Visit: Payer: Self-pay

## 2022-01-31 ENCOUNTER — Encounter: Payer: Self-pay | Admitting: Internal Medicine

## 2022-01-31 VITALS — BP 128/76 | HR 65 | Temp 98.4°F | Ht 67.0 in | Wt 179.0 lb

## 2022-01-31 DIAGNOSIS — Z0001 Encounter for general adult medical examination with abnormal findings: Secondary | ICD-10-CM

## 2022-01-31 DIAGNOSIS — R002 Palpitations: Secondary | ICD-10-CM | POA: Diagnosis not present

## 2022-01-31 DIAGNOSIS — R739 Hyperglycemia, unspecified: Secondary | ICD-10-CM | POA: Diagnosis not present

## 2022-01-31 DIAGNOSIS — I10 Essential (primary) hypertension: Secondary | ICD-10-CM

## 2022-01-31 DIAGNOSIS — E559 Vitamin D deficiency, unspecified: Secondary | ICD-10-CM | POA: Diagnosis not present

## 2022-01-31 DIAGNOSIS — Z0000 Encounter for general adult medical examination without abnormal findings: Secondary | ICD-10-CM

## 2022-01-31 DIAGNOSIS — E7800 Pure hypercholesterolemia, unspecified: Secondary | ICD-10-CM

## 2022-01-31 DIAGNOSIS — E538 Deficiency of other specified B group vitamins: Secondary | ICD-10-CM

## 2022-01-31 MED ORDER — METOPROLOL TARTRATE 50 MG PO TABS: 50.0000 mg | ORAL_TABLET | Freq: Two times a day (BID) | ORAL | 3 refills | Status: DC

## 2022-01-31 MED ORDER — FLUTICASONE PROPIONATE 50 MCG/ACT NA SUSP: NASAL | 0 refills | Status: DC

## 2022-01-31 MED ORDER — HYDROCHLOROTHIAZIDE 25 MG PO TABS: 25.0000 mg | ORAL_TABLET | Freq: Every day | ORAL | 3 refills | Status: DC

## 2022-01-31 MED ORDER — ATORVASTATIN CALCIUM 20 MG PO TABS: 20.0000 mg | ORAL_TABLET | Freq: Every day | ORAL | 3 refills | Status: DC

## 2022-01-31 NOTE — Assessment & Plan Note (Signed)
Last vitamin D Lab Results  Component Value Date   VD25OH 58.26 01/27/2022   Stable, cont oral replacement  

## 2022-01-31 NOTE — Assessment & Plan Note (Signed)
BP Readings from Last 3 Encounters:  01/31/22 128/76  09/15/21 126/81  05/13/21 (!) 158/84   Stable, pt to continue medical treatment hct, lopressor

## 2022-01-31 NOTE — Progress Notes (Signed)
Patient ID: Martin Walker, male   DOB: 20-Aug-1956, 66 y.o.   MRN: [id number]         Chief Complaint:: wellness exam and ow vit d, htn, hyperglycemia, hld       HPI:  Martin Walker is a 66 y.o. 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  Pt denies chest pain, increased sob or doe, wheezing, orthopnea, PND, increased LE swelling, palpitations, dizziness or syncope.   Pt denies polydipsia, polyuria, or new focal neuro s/s.   Pt denies fever, wt loss, night sweats, loss of appetite, or other constitutional symptoms  Not taking Vit D  s/p covid infection spring 2022.     Wt Readings from Last 3 Encounters:  01/31/22 179 lb (81.2 kg)  09/15/21 175 lb (79.4 kg)  09/01/21 175 lb (79.4 kg)   BP Readings from Last 3 Encounters:  01/31/22 128/76  09/15/21 126/81  05/13/21 (!) 158/84   Immunization History  Administered Date(s) Administered   Influenza Split 10/06/2011, 09/20/2012   Influenza, High Dose Seasonal PF 10/10/2013   Influenza,inj,Quad PF,6+ Mos 11/04/2014, 10/28/2015, 10/06/2016, 10/11/2017, 10/11/2018, 10/03/2019, 10/01/2020, 09/22/2021   PFIZER Comirnaty(Gray Top)Covid-19 Tri-Sucrose Vaccine 07/07/2021   PFIZER(Purple Top)SARS-COV-2 Vaccination 04/10/2020, 05/06/2020, 11/27/2020   Tdap 10/22/2012   There are no preventive care reminders to display for this patient.     Past Medical History:  Diagnosis Date   Allergy    seasonal allerties   Arthritis    Asthma    chilhood   Hyperlipidemia    Hypertension    Irregular heart beat    on medication.   Rhinitis, allergic    skin test pos 11/26/08- dust,cat   Rhinosinusitis    recurrent   Past Surgical History:  Procedure Laterality Date   COLONOSCOPY     left knee     right wrist     ROTATOR CUFF REPAIR     left    reports that he quit smoking about  10 years ago. His smoking use included cigarettes. He has a 52.50 pack-year smoking history. He has quit using smokeless tobacco. He reports current alcohol use. He reports that he does not currently use drugs after having used the following drugs: Marijuana. family history includes Colon cancer in an other family member; Heart disease in his father; Hypertension in his mother; Lung cancer in an other family member; Other in an other family member; Thyroid disease in his mother. Allergies  Allergen Reactions   Codeine     Causes peeling of the hands   Current Outpatient Medications on File Prior to Visit  Medication Sig Dispense Refill   ALREX 0.2 % SUSP Place 2 drops into both eyes as needed. As directed     aspirin EC 81 MG tablet Take 1 tablet (81 mg total) by mouth daily. 90 tablet 11   fluorouracil (EFUDEX) 5 % cream Apply topically 2 (two) times daily.     hydrocortisone (ANUSOL-HC) 25 MG suppository Place 1 suppository (25 mg total) rectally at bedtime as needed for hemorrhoids or anal itching. 12 suppository 3   hydrocortisone 2.5 % cream Apply topically 2 (two) times a week.     ketoconazole (NIZORAL) 2 % cream Apply topically.     raNITIdine HCl (ZANTAC  PO) Take by mouth.     VITAMIN D PO Take by mouth.     celecoxib (CELEBREX) 200 MG capsule Take 1 capsule (200 mg total) by mouth 2 (two) times daily as needed. (Patient not taking: Reported on 09/01/2021) 60 capsule 5   No current facility-administered medications on file prior to visit.        ROS:  All others reviewed and negative.  Objective        PE:  BP 128/76 (BP Location: Left Arm, Patient Position: Sitting, Cuff Size: Large)    Pulse 65    Temp 98.4 F (36.9 C) (Oral)    Ht 5\' 7"  (1.702 m)    Wt 179 lb (81.2 kg)    SpO2 97%    BMI 28.04 kg/m                 Constitutional: Pt appears in NAD               HENT: Head: NCAT.                Right Ear: External ear normal.                 Left Ear: External ear normal.                 Eyes: . Pupils are equal, round, and reactive to light. Conjunctivae and EOM are normal               Nose: without d/c or deformity               Neck: Neck supple. Gross normal ROM               Cardiovascular: Normal rate and regular rhythm.                 Pulmonary/Chest: Effort normal and breath sounds without rales or wheezing.                Abd:  Soft, NT, ND, + BS, no organomegaly               Neurological: Pt is alert. At baseline orientation, motor grossly intact               Skin: Skin is warm. No rashes, no other new lesions, LE edema - none               Psychiatric: Pt behavior is normal without agitation   Micro: none  Cardiac tracings I have personally interpreted today:  none  Pertinent Radiological findings (summarize): none   Lab Results  Component Value Date   WBC 7.8 01/27/2022   HGB 14.5 01/27/2022   HCT 43.3 01/27/2022   PLT 263.0 01/27/2022   GLUCOSE 88 01/27/2022   CHOL 133 01/27/2022   TRIG 110.0 01/27/2022   HDL 40.30 01/27/2022   LDLDIRECT 167.4 12/11/2013   LDLCALC 71 01/27/2022   ALT 23 01/27/2022   AST 27 01/27/2022   NA 140 01/27/2022   K 4.4 01/27/2022   CL 100 01/27/2022   CREATININE 1.24 01/27/2022   BUN 19 01/27/2022   CO2 31 01/27/2022   TSH 1.49 01/27/2022   PSA 2.37 01/27/2022   HGBA1C 5.8 01/20/2021   MICROALBUR 1.0 12/14/2016   Assessment/Plan:  Martin Walker is a 66 y.o. White or Caucasian [1] male with  has a past medical history of Allergy, Arthritis, Asthma, Hyperlipidemia, Hypertension, Irregular heart beat, Rhinitis, allergic, and  Rhinosinusitis.  Vitamin D deficiency Last vitamin D Lab Results  Component Value Date   VD25OH 58.26 01/27/2022   Stable, cont oral replacement   Encounter for well adult exam with abnormal findings Age and sex appropriate education and counseling updated with regular exercise and diet Referrals for preventative services - none Immunizations addressed - declines  shingrix, covid booster Smoking counseling  - none needed Evidence for depression or other mood disorder - none significant Most recent labs reviewed. I have personally reviewed and have noted: 1) the patient's medical and social history 2) The patient's current medications and supplements 3) The patient's height, weight, and BMI have been recorded in the chart   HTN (hypertension) BP Readings from Last 3 Encounters:  01/31/22 128/76  09/15/21 126/81  05/13/21 (!) 158/84   Stable, pt to continue medical treatment hct, lopressor   Hyperglycemia Lab Results  Component Value Date   HGBA1C 5.8 01/20/2021   Stable, pt to continue current medical treatment  - diet  Hyperlipidemia Lab Results  Component Value Date   LDLCALC 71 01/27/2022   Stable, pt to continue current statin lipitor  Followup: No follow-ups on file.  Cathlean Cower, MD 01/31/2022 2:50 PM [facility]

## 2022-01-31 NOTE — Assessment & Plan Note (Signed)
Age and sex appropriate education and counseling updated with regular exercise and diet Referrals for preventative services - none Immunizations addressed - declines shingrix, covid booster Smoking counseling  - none needed Evidence for depression or other mood disorder - none significant Most recent labs reviewed. I have personally reviewed and have noted: 1) the patient's medical and social history 2) The patient's current medications and supplements 3) The patient's height, weight, and BMI have been recorded in the chart

## 2022-01-31 NOTE — Assessment & Plan Note (Signed)
Lab Results  Component Value Date   HGBA1C 5.8 01/20/2021   Stable, pt to continue current medical treatment  - diet

## 2022-01-31 NOTE — Assessment & Plan Note (Signed)
Lab Results  Component Value Date   LDLCALC 71 01/27/2022   Stable, pt to continue current statin lipitor

## 2022-01-31 NOTE — Patient Instructions (Addendum)
Please continue all other medications as before, and refills have been done if requested.  Please have the pharmacy call with any other refills you may need.  Please continue your efforts at being more active, low cholesterol diet, and weight control.  You are otherwise up to date with prevention measures today.  Please keep your appointments with your specialists as you may have planned  Please make an Appointment to return for your 1 year visit, or sooner if needed, with Lab testing by Appointment as well, to be done about 3-5 days before at the [facility] (so this is for TWO appointments - please see the scheduling desk as you leave)  Due to the ongoing Covid 19 pandemic, our lab now requires an appointment for any labs done at our office.  If you need labs done and do not have an appointment, please call our office ahead of time to schedule before presenting to the lab for your testing.

## 2022-02-02 NOTE — Progress Notes (Signed)
Cardiology Office Note   Date:  02/03/2022   ID:  Saxon, Barich 1956/10/28, MRN [id number]  PCP:  Biagio Borg, MD  Cardiologist:   Minus Breeding, MD   Chief Complaint  Patient presents with   Palpitations      History of Present Illness: Martin Walker is a 66 y.o. male who presents for evaluation of palpitations.  He had some chest pain in 2019.  I sent him for a POET (Plain Old Exercise Treadmill) which was non diagnostic.  Perfusion study was negative for ischemia.  At the last visit he had palpitations and was told to take extra beta blocker as needed.  Since I last saw him he has done a little bit better.  He had COVID prior to the last visit and he had increased palpitations and some increased dyspnea following that but this seems to have resolved.  He is no longer having any new shortness of breath, PND or orthopnea.  He occasionally has some palpitations but they do not particular bother him.  He actually does a lot of ice-skating and skates 8 to 12 miles at a time quickly.  With this he denies any cardiovascular symptoms.  He has no chest pressure, neck or arm discomfort.  He had no weight gain or edema.   Past Medical History:  Diagnosis Date   Allergy    seasonal allerties   Arthritis    Asthma    chilhood   Hyperlipidemia    Hypertension    Irregular heart beat    on medication.   Rhinitis, allergic    skin test pos 11/26/08- dust,cat   Rhinosinusitis    recurrent    Past Surgical History:  Procedure Laterality Date   COLONOSCOPY     left knee     right wrist     ROTATOR CUFF REPAIR     left     Current Outpatient Medications  Medication Sig Dispense Refill   ALREX 0.2 % SUSP Place 2 drops into both eyes as needed. As directed     aspirin EC 81 MG tablet Take 1 tablet (81 mg total) by mouth daily. 90 tablet 11   atorvastatin (LIPITOR) 20 MG tablet Take 1 tablet (20 mg total) by mouth daily. 90 tablet 3   fluorouracil (EFUDEX) 5 %  cream Apply topically 2 (two) times daily.     fluticasone (FLONASE) 50 MCG/ACT nasal spray 2 spray each side once daily 48 g 0   hydrochlorothiazide (HYDRODIURIL) 25 MG tablet Take 1 tablet (25 mg total) by mouth daily. 90 tablet 3   hydrocortisone (ANUSOL-HC) 25 MG suppository Place 1 suppository (25 mg total) rectally at bedtime as needed for hemorrhoids or anal itching. 12 suppository 3   hydrocortisone 2.5 % cream Apply topically 2 (two) times a week.     ketoconazole (NIZORAL) 2 % cream Apply topically.     metoprolol tartrate (LOPRESSOR) 50 MG tablet Take 1 tablet (50 mg total) by mouth 2 (two) times daily. 180 tablet 3   VITAMIN D PO Take by mouth.     No current facility-administered medications for this visit.    Allergies:   Codeine    ROS:  Please see the history of present illness.   Otherwise, review of systems are positive for none.   All other systems are reviewed and negative.    PHYSICAL EXAM: VS:  BP 136/90    Pulse 60    Ht 5\' 7"  (  1.702 m)    Wt 178 lb 12.8 oz (81.1 kg)    SpO2 99%    BMI 28.00 kg/m  , BMI Body mass index is 28 kg/m. GENERAL:  Well appearing NECK:  No jugular venous distention, waveform within normal limits, carotid upstroke brisk and symmetric, no bruits, no thyromegaly LUNGS:  Clear to auscultation bilaterally CHEST:  Unremarkable HEART:  PMI not displaced or sustained,S1 and S2 within normal limits, no S3, no S4, no clicks, no rubs, 2 out of 6 brief apical systolic murmur not increasing with the strain phase of Valsalva, no diastolic murmurs ABD:  Flat, positive bowel sounds normal in frequency in pitch, no bruits, no rebound, no guarding, no midline pulsatile mass, no hepatomegaly, no splenomegaly EXT:  2 plus pulses throughout, no edema, no cyanosis no clubbing    EKG:  EKG is  ordered today. The ekg ordered today demonstrates sinus rhythm, rate 60, axis normal, intervals within normal limits, no acute ST-T wave changes.   Recent  Labs: 01/27/2022: ALT 23; BUN 19; Creatinine, Ser 1.24; Hemoglobin 14.5; Platelets 263.0; Potassium 4.4; Sodium 140; TSH 1.49    Lipid Panel    Component Value Date/Time   CHOL 133 01/27/2022 0945   TRIG 110.0 01/27/2022 0945   HDL 40.30 01/27/2022 0945   CHOLHDL 3 01/27/2022 0945   VLDL 22.0 01/27/2022 0945   LDLCALC 71 01/27/2022 0945   LDLDIRECT 167.4 12/11/2013 1347      Wt Readings from Last 3 Encounters:  02/03/22 178 lb 12.8 oz (81.1 kg)  01/31/22 179 lb (81.2 kg)  09/15/21 175 lb (79.4 kg)      Other studies Reviewed: Additional studies/ records that were reviewed today include: Labs. Review of the above records demonstrates: See elsewhere  ASSESSMENT AND PLAN:  Palpitations -  He does well with the current dose of beta-blocker.  No change in therapy.    HTN - His blood pressure is well controlled.  No change in therapy.  Dyslipidemia - His most recent LDL was actually down to 71 with an HDL of 40.  He will remain on the meds as listed.   Murmur - It has been many years since he had an echo and I will follow-up with an echocardiogram  Current medicines are reviewed at length with the patient today.  The patient does not have concerns regarding medicines.  The following changes have been made: None  Labs/ tests ordered today include:   Orders Placed This Encounter  Procedures   EKG 12-Lead   ECHOCARDIOGRAM COMPLETE     Disposition:   FU with me in 1 year   Signed, Minus Breeding, MD  02/03/2022 3:12 PM    [place]

## 2022-02-03 ENCOUNTER — Other Ambulatory Visit: Payer: Self-pay

## 2022-02-03 ENCOUNTER — Encounter: Payer: Self-pay | Admitting: Cardiology

## 2022-02-03 ENCOUNTER — Ambulatory Visit: Payer: Federal, State, Local not specified - PPO | Admitting: Cardiology

## 2022-02-03 VITALS — BP 136/90 | HR 60 | Ht 67.0 in | Wt 178.8 lb

## 2022-02-03 DIAGNOSIS — R011 Cardiac murmur, unspecified: Secondary | ICD-10-CM | POA: Diagnosis not present

## 2022-02-03 DIAGNOSIS — E785 Hyperlipidemia, unspecified: Secondary | ICD-10-CM | POA: Diagnosis not present

## 2022-02-03 DIAGNOSIS — R002 Palpitations: Secondary | ICD-10-CM | POA: Diagnosis not present

## 2022-02-03 DIAGNOSIS — I10 Essential (primary) hypertension: Secondary | ICD-10-CM | POA: Diagnosis not present

## 2022-02-03 NOTE — Patient Instructions (Signed)
°  Testing/Procedures: ° °Your physician has requested that you have an echocardiogram. Echocardiography is a painless test that uses sound waves to create images of your heart. It provides your doctor with information about the size and shape of your heart and how well your heart’s chambers and valves are working. This procedure takes approximately one hour. There are no restrictions for this procedure. [address] ° ° °Follow-Up: °At [facility], you and your health needs are our priority.  As part of our continuing mission to provide you with exceptional heart care, we have created designated Provider Care Teams.  These Care Teams include your primary Cardiologist (physician) and Advanced Practice Providers (APPs -  Physician Assistants and Nurse Practitioners) who all work together to provide you with the care you need, when you need it. ° °We recommend signing up for the patient portal called "MyChart".  Sign up information is provided on this After Visit Summary.  MyChart is used to connect with patients for Virtual Visits (Telemedicine).  Patients are able to view lab/test results, encounter notes, upcoming appointments, etc.  Non-urgent messages can be sent to your provider as well.   °To learn more about what you can do with MyChart, go to [email or web address].   ° °Your next appointment:   °12 month(s) ° °The format for your next appointment:   °In Person ° °Provider:   °James Hochrein, MD   ° ° ° °

## 2022-02-08 ENCOUNTER — Telehealth: Payer: Self-pay | Admitting: Internal Medicine

## 2022-02-08 DIAGNOSIS — R002 Palpitations: Secondary | ICD-10-CM

## 2022-02-08 MED ORDER — FLUTICASONE PROPIONATE 50 MCG/ACT NA SUSP: NASAL | 0 refills | Status: DC

## 2022-02-08 MED ORDER — HYDROCHLOROTHIAZIDE 25 MG PO TABS: 25.0000 mg | ORAL_TABLET | Freq: Every day | ORAL | 3 refills | Status: DC

## 2022-02-08 MED ORDER — ATORVASTATIN CALCIUM 20 MG PO TABS: 20.0000 mg | ORAL_TABLET | Freq: Every day | ORAL | 3 refills | Status: DC

## 2022-02-08 MED ORDER — METOPROLOL TARTRATE 50 MG PO TABS: 50.0000 mg | ORAL_TABLET | Freq: Two times a day (BID) | ORAL | 3 refills | Status: DC

## 2022-02-08 NOTE — Telephone Encounter (Signed)
Patient calling in  Rxs were sent to wrong pharmacy.. please send to correct pharmacy  atorvastatin (LIPITOR) 20 MG tablet fluticasone (FLONASE) 50 MCG/ACT nasal spray hydrochlorothiazide (HYDRODIURIL) 25 MG tablet metoprolol tartrate (LOPRESSOR) 50 MG tablet   [place] ([place]), [place]  Phone:  [phone] Fax:  [phone]

## 2022-02-08 NOTE — Telephone Encounter (Signed)
Called patient and informed patient that his medications was sent to the correct pharmacy

## 2022-02-14 ENCOUNTER — Other Ambulatory Visit: Payer: Self-pay

## 2022-02-14 ENCOUNTER — Ambulatory Visit (HOSPITAL_COMMUNITY): Payer: Federal, State, Local not specified - PPO | Attending: Cardiovascular Disease

## 2022-02-14 DIAGNOSIS — I10 Essential (primary) hypertension: Secondary | ICD-10-CM | POA: Diagnosis not present

## 2022-02-14 DIAGNOSIS — E785 Hyperlipidemia, unspecified: Secondary | ICD-10-CM | POA: Insufficient documentation

## 2022-02-14 DIAGNOSIS — Z87891 Personal history of nicotine dependence: Secondary | ICD-10-CM | POA: Insufficient documentation

## 2022-02-14 DIAGNOSIS — I700 Atherosclerosis of aorta: Secondary | ICD-10-CM | POA: Diagnosis not present

## 2022-02-14 DIAGNOSIS — R011 Cardiac murmur, unspecified: Secondary | ICD-10-CM | POA: Diagnosis not present

## 2022-02-14 DIAGNOSIS — R002 Palpitations: Secondary | ICD-10-CM | POA: Diagnosis not present

## 2022-02-14 DIAGNOSIS — Z8616 Personal history of COVID-19: Secondary | ICD-10-CM | POA: Diagnosis not present

## 2022-02-14 LAB — ECHOCARDIOGRAM COMPLETE
AR max vel: 2.64 cm2
AV Area VTI: 2.48 cm2
AV Area mean vel: 2.64 cm2
AV Mean grad: 8 mmHg
AV Peak grad: 16.5 mmHg
Ao pk vel: 2.03 m/s
Area-P 1/2: 3.48 cm2
S' Lateral: 2.8 cm

## 2022-03-21 ENCOUNTER — Other Ambulatory Visit: Payer: Self-pay | Admitting: Internal Medicine

## 2022-03-21 NOTE — Telephone Encounter (Signed)
Please refill as per office routine med refill policy (all routine meds to be refilled for 3 mo or monthly (per pt preference) up to one year from last visit, then month to month grace period for 3 mo, then further med refills will have to be denied) ? ?

## 2022-06-08 DIAGNOSIS — L821 Other seborrheic keratosis: Secondary | ICD-10-CM | POA: Diagnosis not present

## 2022-06-08 DIAGNOSIS — Z85828 Personal history of other malignant neoplasm of skin: Secondary | ICD-10-CM | POA: Diagnosis not present

## 2022-06-08 DIAGNOSIS — L570 Actinic keratosis: Secondary | ICD-10-CM | POA: Diagnosis not present

## 2022-06-08 DIAGNOSIS — D171 Benign lipomatous neoplasm of skin and subcutaneous tissue of trunk: Secondary | ICD-10-CM | POA: Diagnosis not present

## 2022-06-08 DIAGNOSIS — D1722 Benign lipomatous neoplasm of skin and subcutaneous tissue of left arm: Secondary | ICD-10-CM | POA: Diagnosis not present

## 2022-06-08 DIAGNOSIS — L820 Inflamed seborrheic keratosis: Secondary | ICD-10-CM | POA: Diagnosis not present

## 2022-06-26 ENCOUNTER — Other Ambulatory Visit: Payer: Self-pay | Admitting: Internal Medicine

## 2022-08-26 ENCOUNTER — Ambulatory Visit: Payer: Self-pay | Admitting: Licensed Clinical Social Worker

## 2022-08-26 ENCOUNTER — Encounter: Payer: Self-pay | Admitting: Licensed Clinical Social Worker

## 2022-08-26 NOTE — Patient Instructions (Signed)
Visit Information  Thank you for taking time to visit with me today. Please don't hesitate to contact me if I can be of assistance to you before our next scheduled telephone appointment.  Following are the goals we discussed today:   No further intervention needed at present  Please call the care guide team at [phone] if you need to cancel or reschedule your appointment.   If you are experiencing a Mental Health or [place] or need someone to talk to, please go to [facility] [place] [phone])   Following is a copy of your full plan of care:   Care Coordination Interventions:  Active listening / Reflection utilized  Informed  client about Care Coordination program support  Patient did not state a goal at this time. Patient did not indicate interest in program  Mr. Clausing was given information about Care Management services by the embedded care coordination team including:  Care Management services include personalized support from designated clinical staff supervised by his physician, including individualized plan of care and coordination with other care providers 24/7 contact phone numbers for assistance for urgent and routine care needs. The patient may stop CCM services at any time (effective at the end of the month) by phone call to the office staff.  Patient agreed to services and verbal consent obtained.   Norva Riffle.Andrue Dini MSW, [place] [employer] representative [facility] Care Management [phone]

## 2022-08-26 NOTE — Patient Outreach (Signed)
  Care Coordination   Initial Visit Note   08/26/2022 Name: Joahan Swatzell MRN: [id number] DOB: [birth year]  Avrey Hyser is a 66 y.o. year old male who sees Biagio Borg, MD for primary care. I spoke with  Verlin Dike by phone today.  What matters to the patients health and wellness today? Patient did not indicate interest in program support    Goals Addressed             This Visit's Progress    patient heard information shared about Care Coordination program. Did not state a goal at this time       Care Coordination Interventions:   Active listening / Reflection utilized  Informed  client about Care Coordination program support  Patient did not state a goal at this time. Patient did not indicate interest in program        SDOH assessments and interventions completed:  Yes  SDOH Interventions Today    Flowsheet Row Most Recent Value  SDOH Interventions   Stress Interventions Other (Comment)  [client has anxiety issues]        Care Coordination Interventions Activated:  No  Care Coordination Interventions:  No, not indicated   Follow up plan: No further intervention required.   Encounter Outcome:  Pt. Visit Completed

## 2022-09-07 ENCOUNTER — Telehealth: Payer: Self-pay

## 2022-09-07 NOTE — Telephone Encounter (Signed)
Yes, this would be good to do, though the risk if much lower than getting covid or other infecion

## 2022-09-07 NOTE — Telephone Encounter (Signed)
I called the pt and gave Dr. Jenny Reichmann recommendation that it would be good to do, though the risk if much lower than getting covid or other infection.  Pt understood and will reach out to the pharmacy to schedule getting the latest COVID booster and the RSV vaccine.

## 2022-09-07 NOTE — Telephone Encounter (Signed)
Pt is requesting the medical opinion of Dr. Jenny Reichmann to see if the RSV vaccine is recommended. Pt states that he has a high school daughter.  Please advise.

## 2022-09-15 ENCOUNTER — Ambulatory Visit (INDEPENDENT_AMBULATORY_CARE_PROVIDER_SITE_OTHER): Payer: Federal, State, Local not specified - PPO | Admitting: Internal Medicine

## 2022-09-15 ENCOUNTER — Encounter: Payer: Self-pay | Admitting: Internal Medicine

## 2022-09-15 VITALS — BP 124/86 | HR 86 | Ht 67.0 in | Wt 186.0 lb

## 2022-09-15 DIAGNOSIS — Z23 Encounter for immunization: Secondary | ICD-10-CM | POA: Diagnosis not present

## 2022-09-15 DIAGNOSIS — R739 Hyperglycemia, unspecified: Secondary | ICD-10-CM | POA: Diagnosis not present

## 2022-09-15 DIAGNOSIS — M25511 Pain in right shoulder: Secondary | ICD-10-CM

## 2022-09-15 DIAGNOSIS — E559 Vitamin D deficiency, unspecified: Secondary | ICD-10-CM | POA: Diagnosis not present

## 2022-09-15 DIAGNOSIS — I10 Essential (primary) hypertension: Secondary | ICD-10-CM | POA: Diagnosis not present

## 2022-09-15 MED ORDER — MELOXICAM 15 MG PO TABS: 15.0000 mg | ORAL_TABLET | Freq: Every day | ORAL | 1 refills | Status: AC | PRN

## 2022-09-15 NOTE — Patient Instructions (Addendum)
You had the flu shot today  It appears that you have acute right bicipital tendonitis -   Please take all new medication as prescribed - the mobic for pain  Please stop at the first floor Sports Medicine as you would do better with a cortisone shot as well  Please continue all other medications as before, and refills have been done if requested.  Please have the pharmacy call with any other refills you may need.  Please keep your appointments with your specialists as you may have planned

## 2022-09-15 NOTE — Progress Notes (Signed)
Patient ID: Martin Walker, male   DOB: 1956-06-18, 66 y.o.   MRN: [id number]        Chief Complaint: follow up right shoulder pain, htn, hyperglycemia       HPI:  Martin Walker is a 66 y.o. male here overall doing ok, except for 1 wk worsening right shoulder pain, severe, sharp, worse at the anterior aspect, worse to raise the arm, better to leave the arm lowered, seemed to start some time after lifting 40 lb batteries with the right arm.  Pt denies chest pain, increased sob or doe, wheezing, orthopnea, PND, increased LE swelling, palpitations, dizziness or syncope.   Pt denies polydipsia, polyuria, or new focal neuro s/s.    Pt denies fever, wt loss, night sweats, loss of appetite, or other constitutional symptoms   Due for flu shot.  Wt Readings from Last 3 Encounters:  09/15/22 186 lb (84.4 kg)  02/03/22 178 lb 12.8 oz (81.1 kg)  01/31/22 179 lb (81.2 kg)   BP Readings from Last 3 Encounters:  09/15/22 124/86  02/03/22 136/90  01/31/22 128/76         Past Medical History:  Diagnosis Date   Allergy    seasonal allerties   Arthritis    Asthma    chilhood   Hyperlipidemia    Hypertension    Irregular heart beat    on medication.   Rhinitis, allergic    skin test pos 11/26/08- dust,cat   Rhinosinusitis    recurrent   Past Surgical History:  Procedure Laterality Date   COLONOSCOPY     left knee     right wrist     ROTATOR CUFF REPAIR     left    reports that he quit smoking about 11 years ago. His smoking use included cigarettes. He has a 52.50 pack-year smoking history. He has quit using smokeless tobacco. He reports current alcohol use. He reports that he does not currently use drugs after having used the following drugs: Marijuana. family history includes Colon cancer in an other family member; Heart disease in his father; Hypertension in his mother; Lung cancer in an other family member; Other in an other family member; Thyroid disease in his mother. Allergies   Allergen Reactions   Codeine     Causes peeling of the hands   Current Outpatient Medications on File Prior to Visit  Medication Sig Dispense Refill   ALREX 0.2 % SUSP Place 2 drops into both eyes as needed. As directed     aspirin EC 81 MG tablet Take 1 tablet (81 mg total) by mouth daily. 90 tablet 11   atorvastatin (LIPITOR) 20 MG tablet Take 1 tablet by mouth once daily 90 tablet 3   fluorouracil (EFUDEX) 5 % cream Apply topically 2 (two) times daily.     fluticasone (FLONASE) 50 MCG/ACT nasal spray Use 2 spray(s) in each nostril once daily 48 g 0   hydrochlorothiazide (HYDRODIURIL) 25 MG tablet Take 1 tablet by mouth once daily 90 tablet 3   hydrocortisone (ANUSOL-HC) 25 MG suppository Place 1 suppository (25 mg total) rectally at bedtime as needed for hemorrhoids or anal itching. 12 suppository 3   hydrocortisone 2.5 % cream Apply topically 2 (two) times a week.     ketoconazole (NIZORAL) 2 % cream Apply topically.     metoprolol tartrate (LOPRESSOR) 50 MG tablet Take 1 tablet (50 mg total) by mouth 2 (two) times daily. 180 tablet 3   VITAMIN D PO  Take by mouth.     No current facility-administered medications on file prior to visit.        ROS:  All others reviewed and negative.  Objective        PE:  BP 124/86   Pulse 86   Ht '5\' 7"'$  (1.702 m)   Wt 186 lb (84.4 kg)   SpO2 96%   BMI 29.13 kg/m                 Constitutional: Pt appears in NAD               HENT: Head: NCAT.                Right Ear: External ear normal.                 Left Ear: External ear normal.                Eyes: . Pupils are equal, round, and reactive to light. Conjunctivae and EOM are normal               Nose: without d/c or deformity               Neck: Neck supple. Gross normal ROM               Cardiovascular: Normal rate and regular rhythm.                 Pulmonary/Chest: Effort normal and breath sounds without rales or wheezing.                Right shoulder with marked tender at the  right bicipital tendon insertion site               Neurological: Pt is alert. At baseline orientation, motor grossly intact               Skin: Skin is warm. No rashes, no other new lesions, LE edema - none               Psychiatric: Pt behavior is normal without agitation   Micro: none  Cardiac tracings I have personally interpreted today:  none  Pertinent Radiological findings (summarize): none   Lab Results  Component Value Date   WBC 7.8 01/27/2022   HGB 14.5 01/27/2022   HCT 43.3 01/27/2022   PLT 263.0 01/27/2022   GLUCOSE 88 01/27/2022   CHOL 133 01/27/2022   TRIG 110.0 01/27/2022   HDL 40.30 01/27/2022   LDLDIRECT 167.4 12/11/2013   LDLCALC 71 01/27/2022   ALT 23 01/27/2022   AST 27 01/27/2022   NA 140 01/27/2022   K 4.4 01/27/2022   CL 100 01/27/2022   CREATININE 1.24 01/27/2022   BUN 19 01/27/2022   CO2 31 01/27/2022   TSH 1.49 01/27/2022   PSA 2.37 01/27/2022   HGBA1C 5.8 01/20/2021   MICROALBUR 1.0 12/14/2016   Assessment/Plan:  Martin Walker is a 66 y.o. White or Caucasian [1] male with  has a past medical history of Allergy, Arthritis, Asthma, Hyperlipidemia, Hypertension, Irregular heart beat, Rhinitis, allergic, and Rhinosinusitis.  HTN (hypertension) BP Readings from Last 3 Encounters:  09/15/22 124/86  02/03/22 136/90  01/31/22 128/76   Stable, pt to continue medical treatment hct 25 qd, lopressor 50 bid   Hyperglycemia Lab Results  Component Value Date   HGBA1C 5.8 01/20/2021   Stable, pt to continue current medical treatment  - diet, wt control, excercise  Right shoulder pain Exam c/w bicipital tendonitis - for mobic prn, also refer sport medicine today if possible for cortisone  Vitamin D deficiency Last vitamin D Lab Results  Component Value Date   VD25OH 58.26 01/27/2022   Stable, cont oral replacement  Followup: Return if symptoms worsen or fail to improve.  Cathlean Cower, MD 09/17/2022 2:37 PM [facility]

## 2022-09-17 ENCOUNTER — Encounter: Payer: Self-pay | Admitting: Internal Medicine

## 2022-09-17 NOTE — Assessment & Plan Note (Signed)
Exam c/w bicipital tendonitis - for mobic prn, also refer sport medicine today if possible for cortisone

## 2022-09-17 NOTE — Assessment & Plan Note (Signed)
Lab Results  Component Value Date   HGBA1C 5.8 01/20/2021   Stable, pt to continue current medical treatment  - diet, wt control, excercise

## 2022-09-17 NOTE — Assessment & Plan Note (Signed)
Last vitamin D Lab Results  Component Value Date   VD25OH 58.26 01/27/2022   Stable, cont oral replacement

## 2022-09-17 NOTE — Assessment & Plan Note (Signed)
BP Readings from Last 3 Encounters:  09/15/22 124/86  02/03/22 136/90  01/31/22 128/76   Stable, pt to continue medical treatment hct 25 qd, lopressor 50 bid

## 2022-09-19 ENCOUNTER — Ambulatory Visit: Payer: Federal, State, Local not specified - PPO

## 2022-09-19 IMAGING — CT CT CERVICAL SPINE W/O CM
3 of 4 series · 13 of 33 positions shown, 16 images · non-contrast
Comparison: None.

CLINICAL DATA: Fall while skating with head injury, initial
encounter

EXAM:
CT HEAD WITHOUT CONTRAST
CT CERVICAL SPINE WITHOUT CONTRAST
TECHNIQUE: Multidetector CT imaging of the head and cervical spine was
performed following the standard protocol without intravenous
contrast. Multiplanar CT image reconstructions of the cervical spine
were also generated.

[Series 8: orthogonal axials · axial · 0.21mm/px · z∈[-289,-162]mm · 5 of 107 slices shown, 7 images]
[im 18/107  soft-tissue]
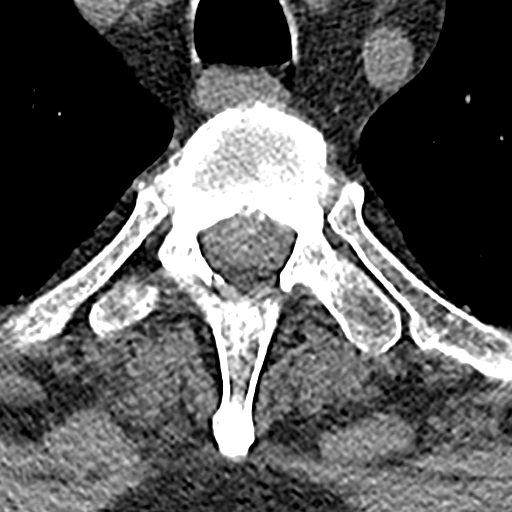
[im 18/107  bone]
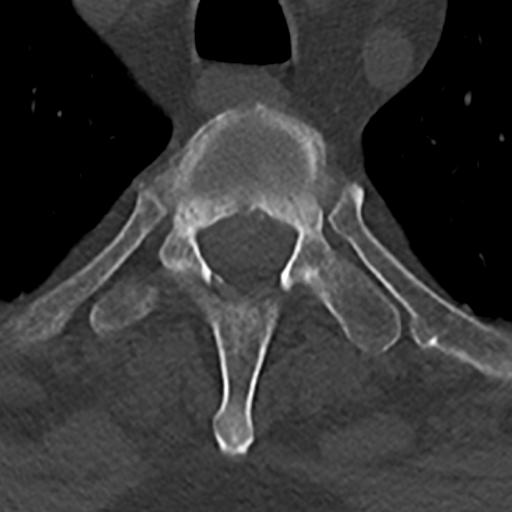
[im 36/107  bone]
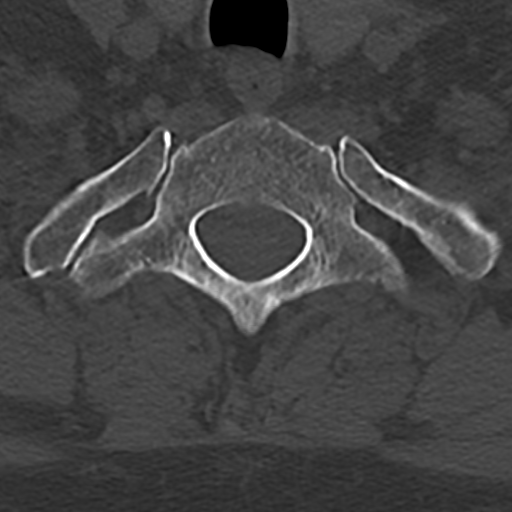
[im 54/107  bone]
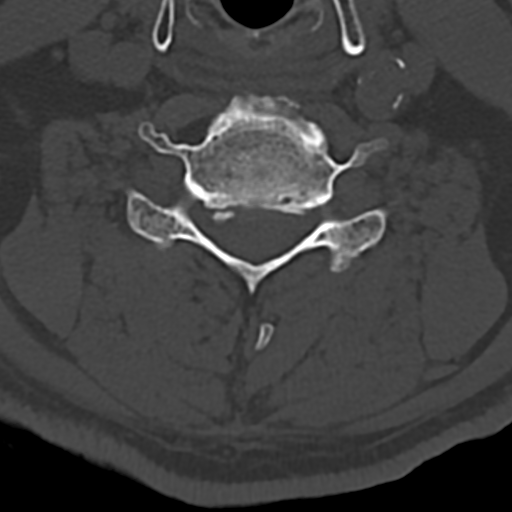
[im 71/107  bone]
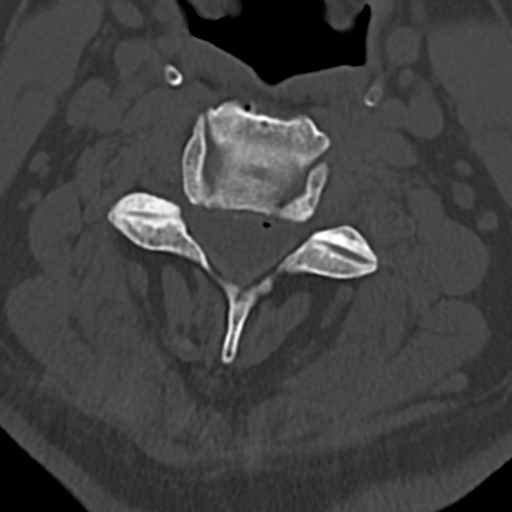
[im 89/107  soft-tissue]
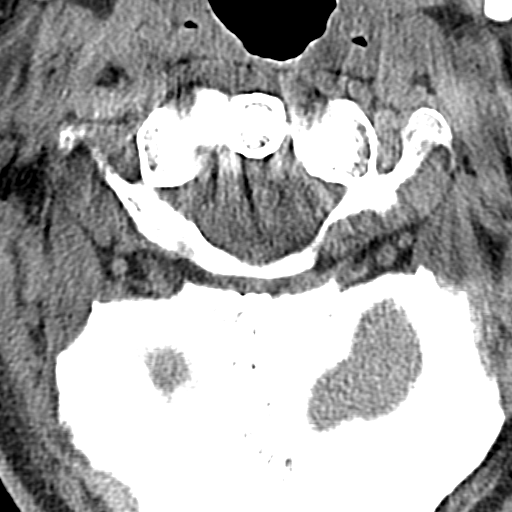
[im 89/107  bone]
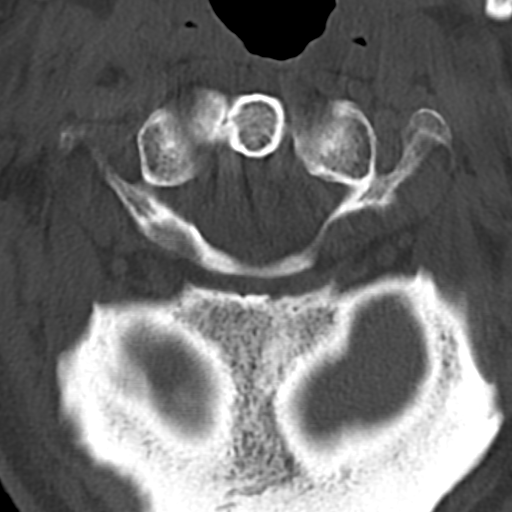

[Series 9: sag bone · sagittal · 0.37mm/px · 5 of 61 slices shown, 6 images]
[im 21/61  bone]
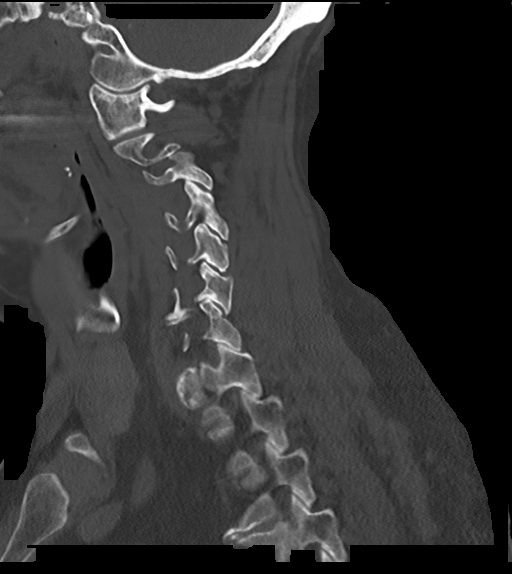
[im 26/61  bone]
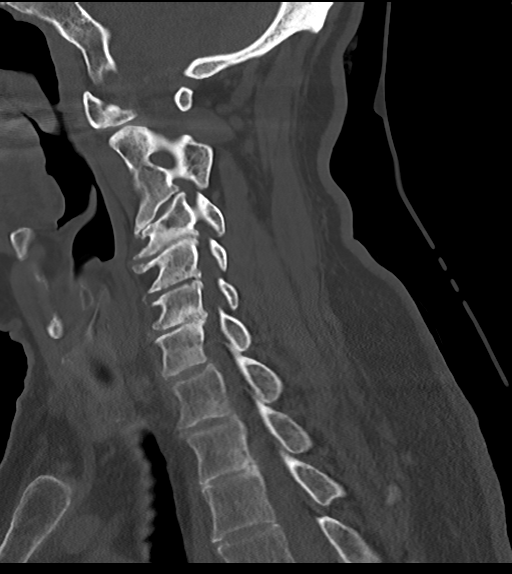
[im 31/61  soft-tissue]
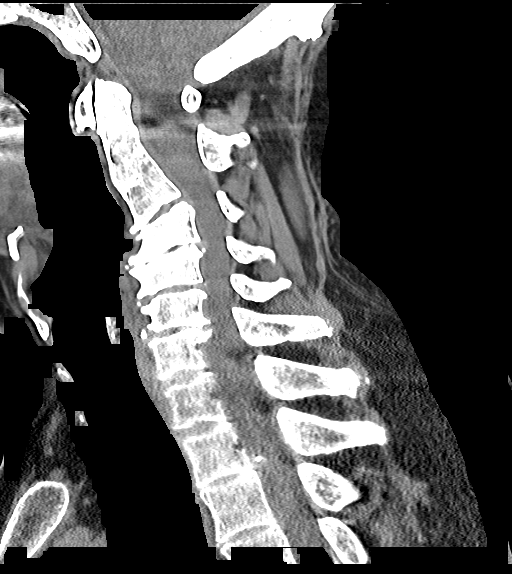
[im 31/61  bone]
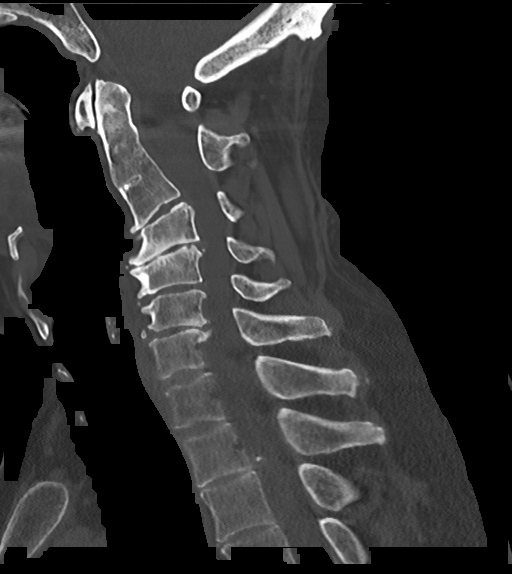
[im 36/61  bone]
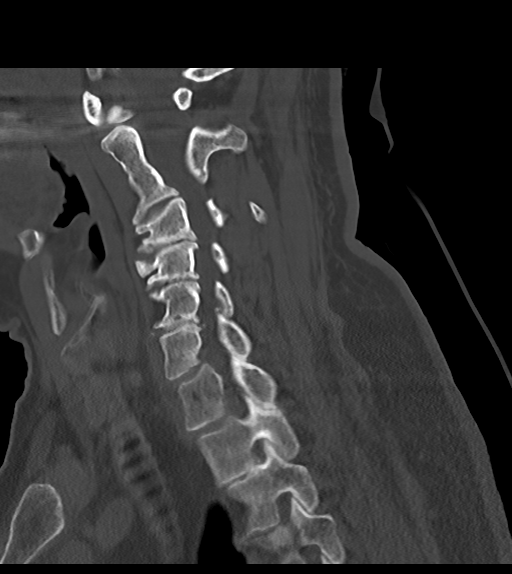
[im 41/61  bone]
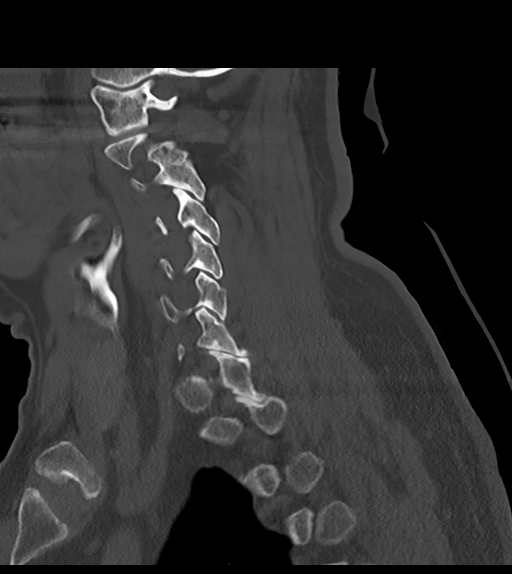

[Series 10: cor bone · coronal · 0.31mm/px · 3 of 73 slices shown]
[im 15/73  bone]
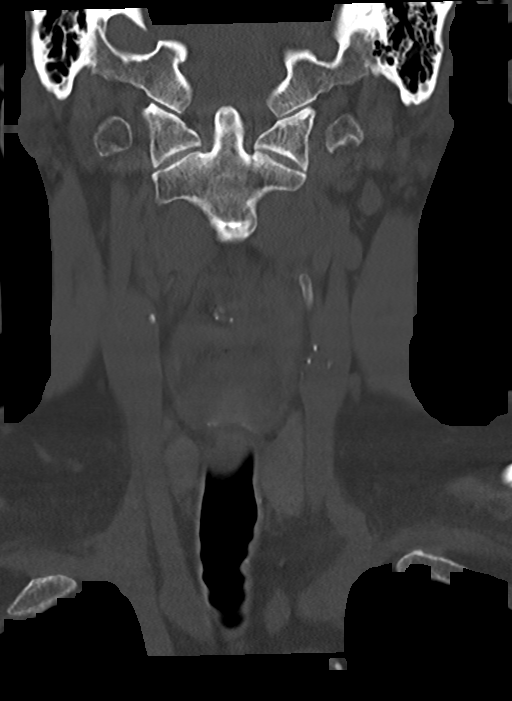
[im 29/73  bone]
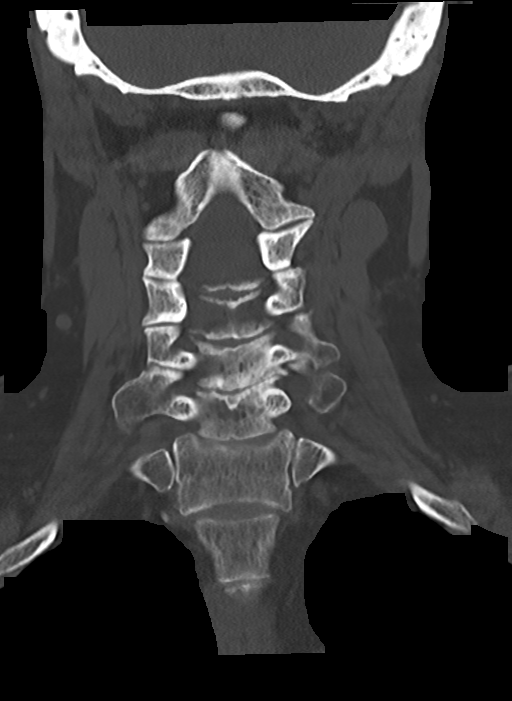
[im 44/73  bone]
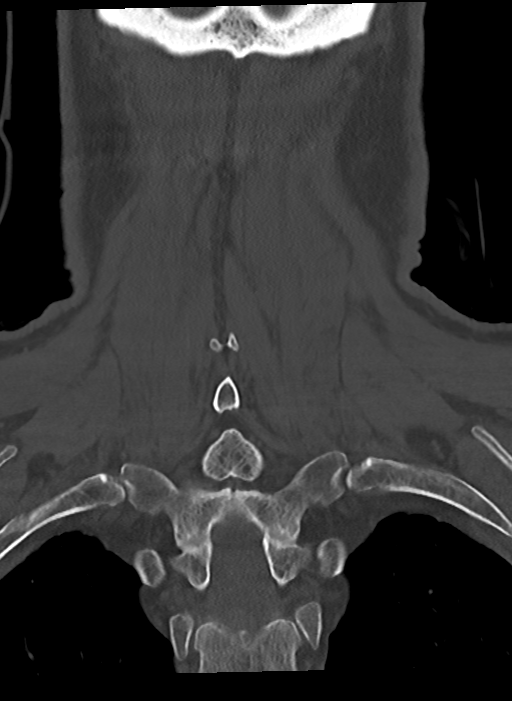

[13 of 33 positions shown; findings below may reference images not displayed]

FINDINGS: CT HEAD FINDINGS

Brain: No evidence of acute infarction, hemorrhage, hydrocephalus,
extra-axial collection or mass lesion/mass effect.

Vascular: No hyperdense vessel or unexpected calcification.

Skull: Normal. Negative for fracture or focal lesion.

Sinuses/Orbits: No acute finding.

Other: None.

CT CERVICAL SPINE FINDINGS

Alignment: Loss of normal cervical lordosis is noted in the upper
cervical spine.

Skull base and vertebrae: Congenital appearing fusion of C2 and C3
are noted. This involves the posterior elements as well. Disc space
narrowing from C3 to C7 is noted with associated osteophytic
changes. Mild facet hypertrophic changes are noted. Mild neural
foraminal narrowing at these levels is seen as well. No acute
fracture or acute facet abnormality is noted.

Soft tissues and spinal canal: Surrounding soft tissue structures
show vascular calcifications. No other focal abnormality is noted.

Upper chest: Visualized lung apices are within normal limits.

Other: None
IMPRESSION: CT of the head: No acute intracranial abnormality noted.

CT of the cervical spine: Loss of the normal cervical lordosis
related to degenerative change. No acute abnormality is noted.

## 2022-09-19 IMAGING — CT CT HEAD W/O CM
4 series · 17 of 47 positions shown, 19 images · non-contrast
Comparison: None.

CLINICAL DATA: Fall while skating with head injury, initial
encounter

EXAM:
CT HEAD WITHOUT CONTRAST
CT CERVICAL SPINE WITHOUT CONTRAST
TECHNIQUE: Multidetector CT imaging of the head and cervical spine was
performed following the standard protocol without intravenous
contrast. Multiplanar CT image reconstructions of the cervical spine
were also generated.

[Series 3: head wo · axial · 0.44mm/px · z∈[-122,-2]mm · 7 of 34 slices shown, 9 images]
[im 5/34  brain]
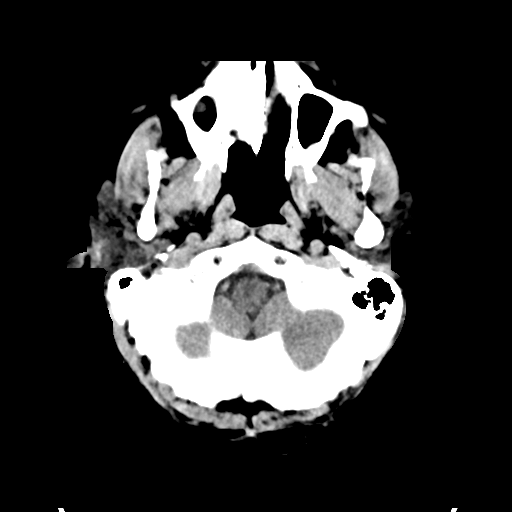
[im 5/34  bone]
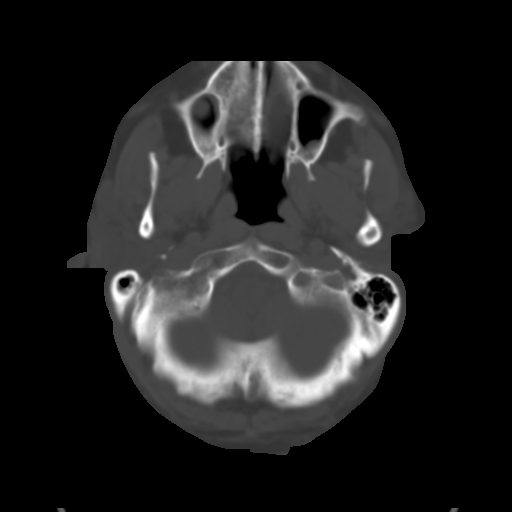
[im 9/34  brain]
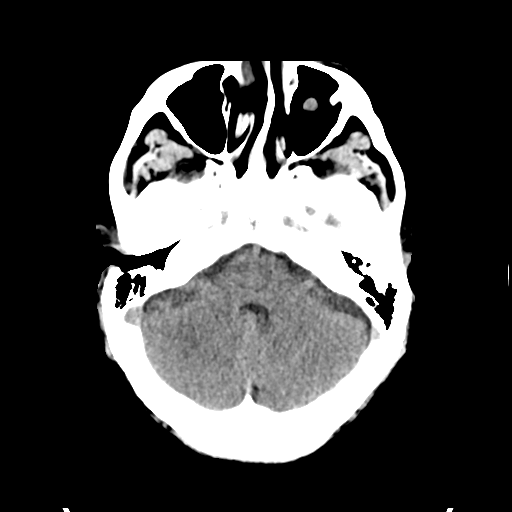
[im 13/34  brain]
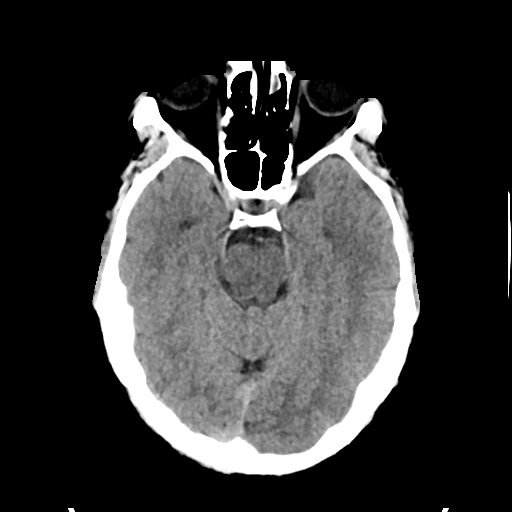
[im 17/34  brain]
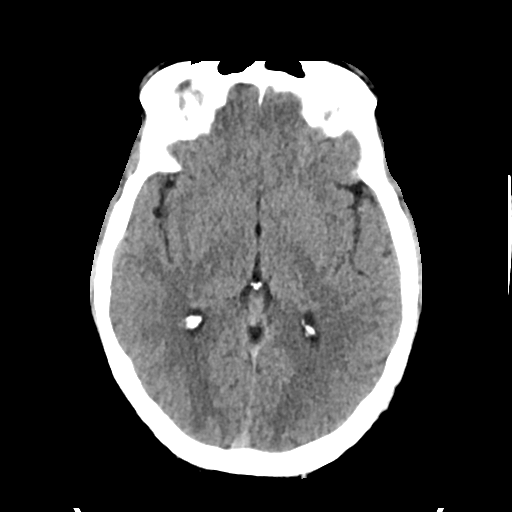
[im 21/34  brain]
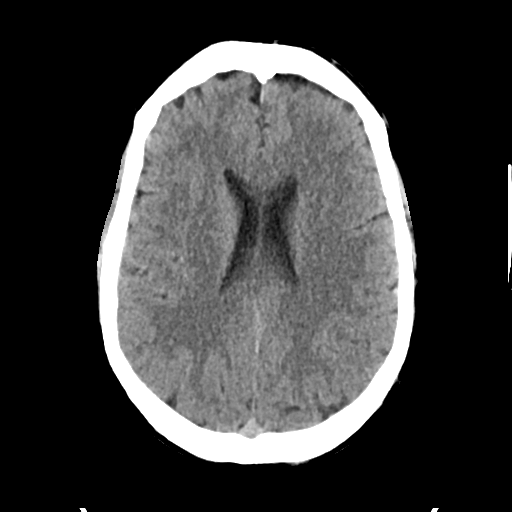
[im 21/34  bone]
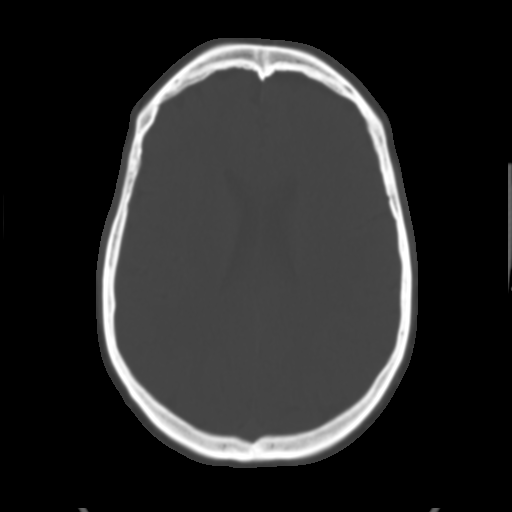
[im 25/34  brain]
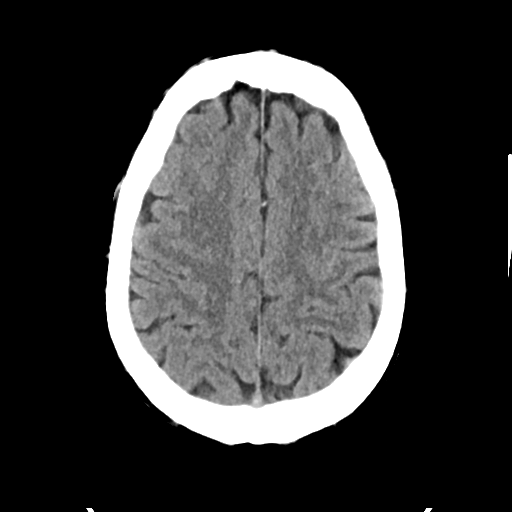
[im 29/34  brain]
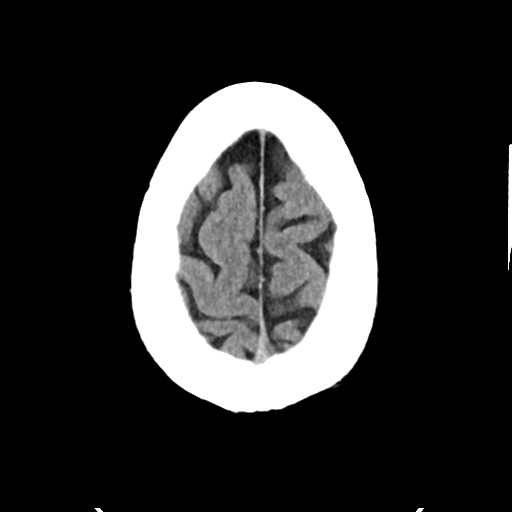

[Series 4: head bone · axial · 0.44mm/px · z∈[-126,-68]mm · 4 of 85 slices shown]
[im 9/85  bone]
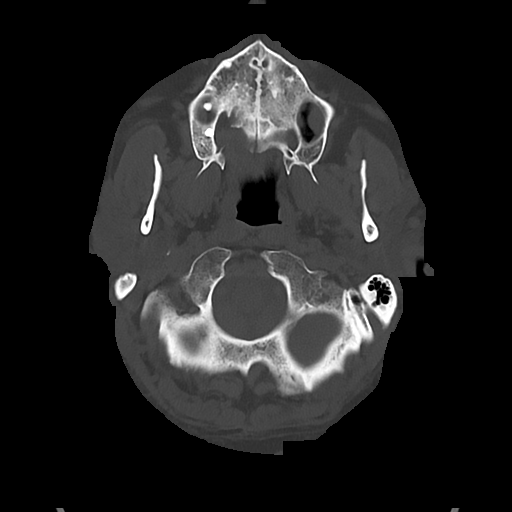
[im 17/85  bone]
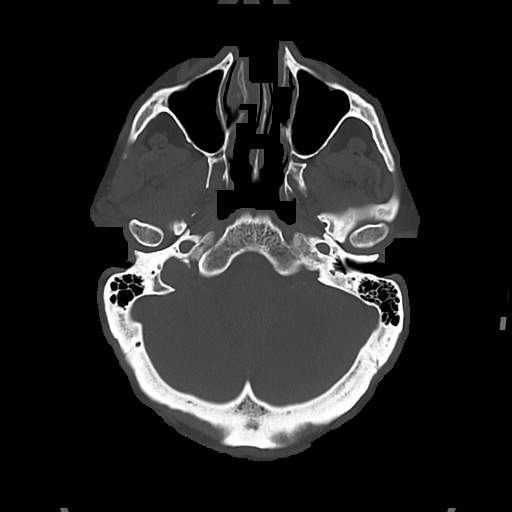
[im 26/85  bone]
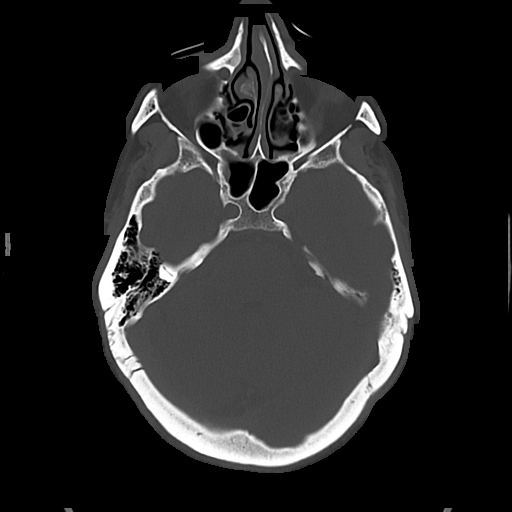
[im 38/85  bone]
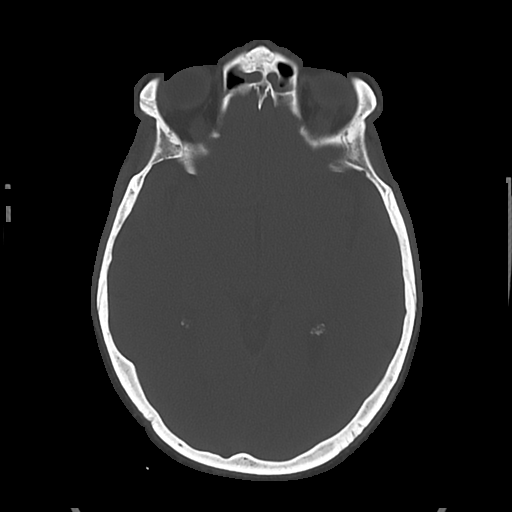

[Series 5: cor soft · coronal · 0.37mm/px · 3 of 73 slices shown]
[im 25/73  brain]
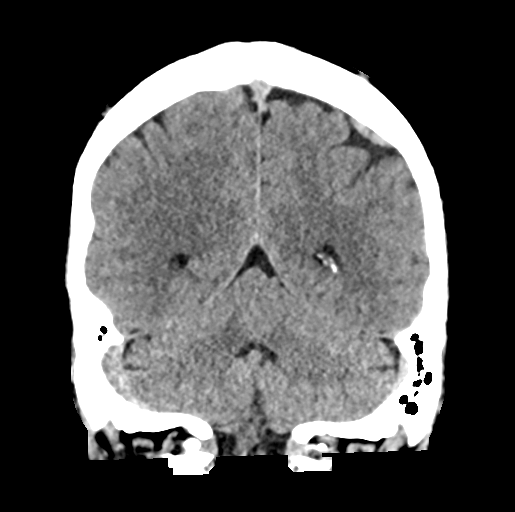
[im 33/73  brain]
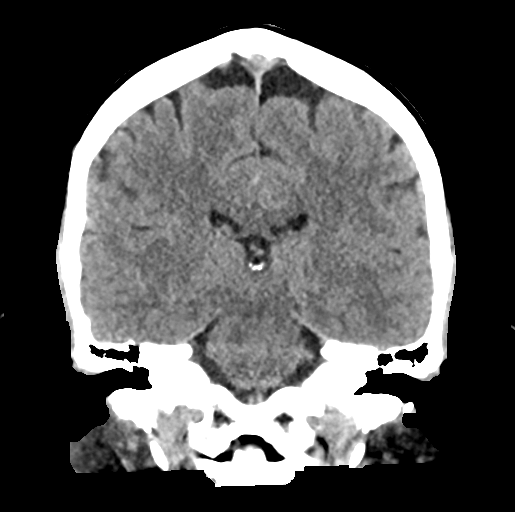
[im 41/73  brain]
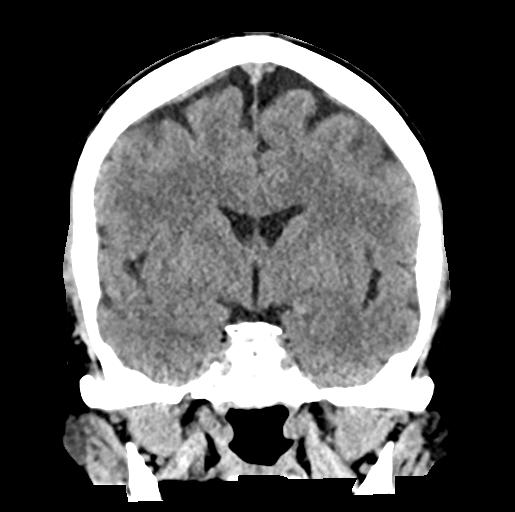

[Series 6: sag soft · sagittal · 0.40mm/px · 3 of 61 slices shown]
[im 21/61  brain]
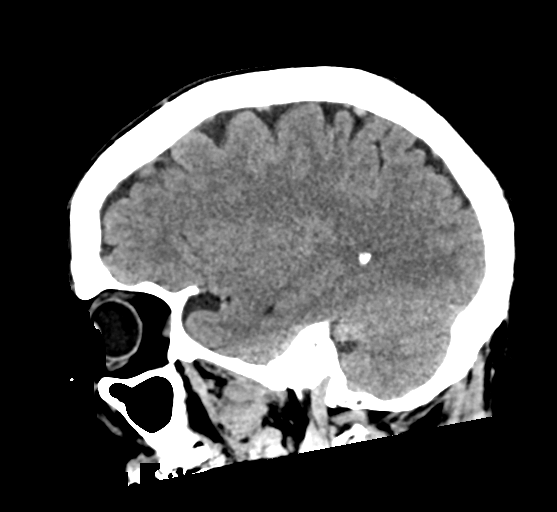
[im 31/61  brain]
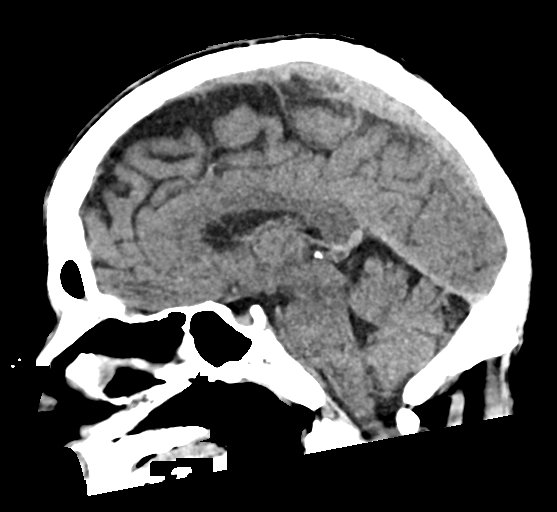
[im 41/61  brain]
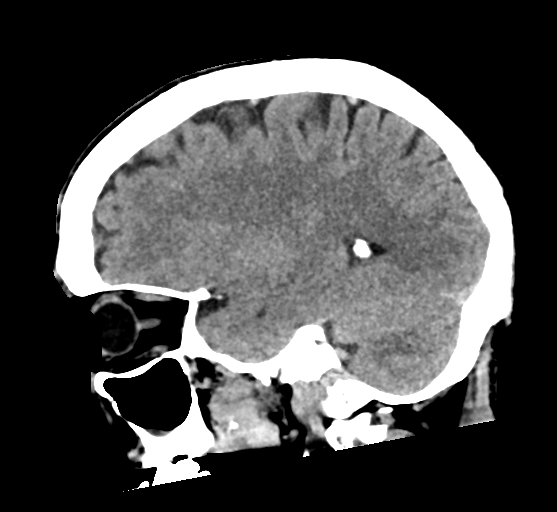

[17 of 47 positions shown; findings below may reference images not displayed]

FINDINGS: CT HEAD FINDINGS

Brain: No evidence of acute infarction, hemorrhage, hydrocephalus,
extra-axial collection or mass lesion/mass effect.

Vascular: No hyperdense vessel or unexpected calcification.

Skull: Normal. Negative for fracture or focal lesion.

Sinuses/Orbits: No acute finding.

Other: None.

CT CERVICAL SPINE FINDINGS

Alignment: Loss of normal cervical lordosis is noted in the upper
cervical spine.

Skull base and vertebrae: Congenital appearing fusion of C2 and C3
are noted. This involves the posterior elements as well. Disc space
narrowing from C3 to C7 is noted with associated osteophytic
changes. Mild facet hypertrophic changes are noted. Mild neural
foraminal narrowing at these levels is seen as well. No acute
fracture or acute facet abnormality is noted.

Soft tissues and spinal canal: Surrounding soft tissue structures
show vascular calcifications. No other focal abnormality is noted.

Upper chest: Visualized lung apices are within normal limits.

Other: None
IMPRESSION: CT of the head: No acute intracranial abnormality noted.

CT of the cervical spine: Loss of the normal cervical lordosis
related to degenerative change. No acute abnormality is noted.

## 2022-09-20 ENCOUNTER — Ambulatory Visit (INDEPENDENT_AMBULATORY_CARE_PROVIDER_SITE_OTHER): Payer: Federal, State, Local not specified - PPO | Admitting: Family Medicine

## 2022-09-20 ENCOUNTER — Ambulatory Visit (INDEPENDENT_AMBULATORY_CARE_PROVIDER_SITE_OTHER): Payer: Federal, State, Local not specified - PPO

## 2022-09-20 ENCOUNTER — Ambulatory Visit: Payer: Self-pay

## 2022-09-20 VITALS — BP 162/104 | HR 59 | Ht 67.0 in | Wt 188.0 lb

## 2022-09-20 DIAGNOSIS — M25511 Pain in right shoulder: Secondary | ICD-10-CM

## 2022-09-20 NOTE — Progress Notes (Unsigned)
I, Peterson Lombard, LAT, ATC acting as a scribe for Lynne Leader, MD.  Martin Walker is a 66 y.o. male who presents to [place] [facility] today for R shoulder pain. Pt was previously seen by Dr. Georgina Snell on 05/13/21 for R heel pain. Today, pt c/o R shoulder pain ongoing since 9/16. Pt notes R shoulder pain seemed to start some time after lifting 40 lb batteries and some cases of water with the R arm, but he's unsure if this was the cause. Pt c/o R shoulder pain waking him up at night. Pt locates pain to the posterior and anterior aspect of the R shoulder.  Neck pain: no Radiates: no UE numbness/tingling: no UE weakness: no Aggravates:  Treatments tried: meloxicam- not yet picked up, stretching, roll-on pain reliever, Icy Hot patches  Dx imaging: 03/02/21 C-spine CT  Pertinent review of systems: No fevers or chills  Relevant historical information: Hypertension   Exam:  BP (!) 162/104   Pulse (!) 59   Ht '5\' 7"'$  (1.702 m)   Wt 188 lb (85.3 kg)   SpO2 96%   BMI 29.44 kg/m  General: Well Developed, well nourished, and in no acute distress.   MSK: Right shoulder: Nodular mass superior right shoulder nontender. Normal motion. Strength 4/5 to abduction.  5/5 external and internal rotation. Positive Hawkins and Neer's test.  Negative Yergason speeds test.    Lab and Radiology Results  Diagnostic Limited MSK Ultrasound of: Right shoulder Biceps tendon intact normal. Subscapularis tendon is thin but intact without visible tear. Supraspinatus tendon intact without visible tear.  Mild subacromial bursitis.  Infraspinatus tendon normal. AC joint is degenerative appearing Mass superior shoulder consistent appearance with lipoma. Impression: Thin appearing subscapularis tendon mild subacromial bursitis and lipoma superior shoulder.   X-ray images right shoulder obtained today personally and independently interpreted Mild glenohumeral DJD.  Mild AC DJD.  No  acute fractures. Await formal radiology review   Assessment and Plan: 66 y.o. male with right shoulder pain.  Patient had severe pain at a few days ago which is now resolving.  Pain thought to be due to early adhesive capsulitis or bursitis/shoulder impingement.  Plan for home exercise program.  If not improving consider injection or physical therapy.  Check back in 6 weeks.  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  Watchful waiting.  Consider MRI in the future if needed.   PDMP not reviewed this encounter. Orders Placed This Encounter  Procedures   [place] LIMITED JOINT SPACE STRUCTURES UP RIGHT(NO LINKED CHARGES)    Order Specific Question:   Reason for Exam (SYMPTOM  OR DIAGNOSIS REQUIRED)    Answer:   right shoulder pain    Order Specific Question:   Preferred imaging location?    Answer:   [place]   DG Shoulder Right    Standing Status:   Future    Number of Occurrences:   1    Standing Expiration Date:   09/21/2023    Order Specific Question:   Reason for Exam (SYMPTOM  OR DIAGNOSIS REQUIRED)    Answer:   right shoulder pain    Order Specific Question:   Preferred imaging location?    Answer:   [facility]   No orders of the defined types were placed in this encounter.    Discussed warning signs or symptoms. Please see discharge instructions. Patient expresses understanding.   The above documentation has been reviewed and is accurate and complete  Lynne Leader, M.D.

## 2022-09-20 NOTE — Patient Instructions (Addendum)
Thank you for coming in today.   Please get an Xray today before you leave   Work on your home exercises  Check back in 6 weeks

## 2022-09-22 ENCOUNTER — Other Ambulatory Visit: Payer: Self-pay | Admitting: Internal Medicine

## 2022-09-22 NOTE — Progress Notes (Signed)
Right shoulder x-ray shows evidence of some insertional rotator cuff tendinitis although we cannot tell for sure on x-ray.

## 2022-09-26 ENCOUNTER — Ambulatory Visit: Payer: Federal, State, Local not specified - PPO

## 2022-11-02 ENCOUNTER — Ambulatory Visit (INDEPENDENT_AMBULATORY_CARE_PROVIDER_SITE_OTHER): Payer: Federal, State, Local not specified - PPO | Admitting: Family Medicine

## 2022-11-02 ENCOUNTER — Ambulatory Visit (INDEPENDENT_AMBULATORY_CARE_PROVIDER_SITE_OTHER): Payer: Federal, State, Local not specified - PPO

## 2022-11-02 VITALS — BP 170/90 | HR 59 | Ht 67.0 in | Wt 186.0 lb

## 2022-11-02 DIAGNOSIS — M25511 Pain in right shoulder: Secondary | ICD-10-CM | POA: Diagnosis not present

## 2022-11-02 DIAGNOSIS — G8929 Other chronic pain: Secondary | ICD-10-CM | POA: Diagnosis not present

## 2022-11-02 DIAGNOSIS — Z23 Encounter for immunization: Secondary | ICD-10-CM

## 2022-11-02 NOTE — Progress Notes (Signed)
After obtaining consent, and per orders of Dr. Jenny Reichmann, injection of Shingrix given the right deltoid by Marrian Salvage. Patient instructed to report any adverse reaction to me immediately.

## 2022-11-02 NOTE — Patient Instructions (Signed)
Thank you for coming in today.   Continue home exercises.  Recheck as needed.   We can do injection or formal physical therapy in the future if needed.

## 2022-11-02 NOTE — Progress Notes (Unsigned)
   I, Peterson Lombard, LAT, ATC acting as a scribe for Lynne Leader, MD.  Martin Walker is a 66 y.o. male who presents to [place] at [facility] today for his 6-week follow-up for right shoulder pain.  Patient was last seen by Dr. Georgina Snell on 09/20/2022 and was taught HEP.  Today, patient reports R shoulder is feeling pretty good. He has only had a few episodes of "soreness" when he overexerts.   Dx imaging: 09/20/22 R shoulder XR 03/02/21 C-spine CT   Pertinent review of systems: No fevers or chills  Relevant historical information: Hypertension   Exam:  BP (!) 170/90   Pulse (!) 59   Ht '5\' 7"'$  (1.702 m)   Wt 186 lb (84.4 kg)   SpO2 98%   BMI 29.13 kg/m  General: Well Developed, well nourished, and in no acute distress.   MSK: Right shoulder normal appearing normal motion intact strength.    Lab and Radiology Results EXAM: RIGHT SHOULDER - 2+ VIEW   COMPARISON:  09/01/2017   FINDINGS: No fracture, dislocation, or joint effusion.   Soft tissues are unremarkable.   Mild irregularity of the greater tuberosity indicative of rotator cuff tendinopathy.   IMPRESSION: 1. No acute findings. 2. Mild irregularity of the greater tuberosity indicative of rotator cuff tendinopathy.     Electronically Signed   By: Miachel Roux M.D.   On: 09/21/2022 14:05 I, Lynne Leader, personally (independently) visualized and performed the interpretation of the images attached in this note.      Assessment and Plan: 66 y.o. male with right shoulder pain thought to be due to impingement.  Doing well with home exercise program.  If needed could add formal physical therapy or injection.  Check back as needed. Total encounter time 20 minutes including face-to-face time with the patient and, reviewing past medical record, and charting on the date of service.     PDMP not reviewed this encounter. No orders of the defined types were placed in this encounter.  No orders of  the defined types were placed in this encounter.    Discussed warning signs or symptoms. Please see discharge instructions. Patient expresses understanding.   The above documentation has been reviewed and is accurate and complete Lynne Leader, M.D.

## 2022-12-16 ENCOUNTER — Other Ambulatory Visit: Payer: Self-pay | Admitting: Internal Medicine

## 2023-02-09 ENCOUNTER — Ambulatory Visit (INDEPENDENT_AMBULATORY_CARE_PROVIDER_SITE_OTHER): Payer: Federal, State, Local not specified - PPO

## 2023-02-09 DIAGNOSIS — Z23 Encounter for immunization: Secondary | ICD-10-CM | POA: Diagnosis not present

## 2023-02-09 NOTE — Progress Notes (Signed)
After obtaining consent, and per orders of Dr. Jenny Reichmann, injection of Shingle given by Marrian Salvage. Patient instructed to report any adverse reaction to me immediately.

## 2023-02-17 ENCOUNTER — Other Ambulatory Visit (INDEPENDENT_AMBULATORY_CARE_PROVIDER_SITE_OTHER): Payer: Federal, State, Local not specified - PPO

## 2023-02-17 DIAGNOSIS — Z0001 Encounter for general adult medical examination with abnormal findings: Secondary | ICD-10-CM

## 2023-02-17 DIAGNOSIS — E559 Vitamin D deficiency, unspecified: Secondary | ICD-10-CM | POA: Diagnosis not present

## 2023-02-17 DIAGNOSIS — E7800 Pure hypercholesterolemia, unspecified: Secondary | ICD-10-CM

## 2023-02-17 DIAGNOSIS — E538 Deficiency of other specified B group vitamins: Secondary | ICD-10-CM | POA: Diagnosis not present

## 2023-02-17 DIAGNOSIS — R739 Hyperglycemia, unspecified: Secondary | ICD-10-CM | POA: Diagnosis not present

## 2023-02-17 LAB — CBC WITH DIFFERENTIAL/PLATELET
Basophils Absolute: 0.1 10*3/uL (ref 0.0–0.1)
Basophils Relative: 0.8 % (ref 0.0–3.0)
Eosinophils Absolute: 0.4 10*3/uL (ref 0.0–0.7)
Eosinophils Relative: 4.6 % (ref 0.0–5.0)
HCT: 45.1 % (ref 39.0–52.0)
Hemoglobin: 15.3 g/dL (ref 13.0–17.0)
Lymphocytes Relative: 31.5 % (ref 12.0–46.0)
Lymphs Abs: 2.8 10*3/uL (ref 0.7–4.0)
MCHC: 33.9 g/dL (ref 30.0–36.0)
MCV: 92.8 fl (ref 78.0–100.0)
Monocytes Absolute: 0.7 10*3/uL (ref 0.1–1.0)
Monocytes Relative: 8.3 % (ref 3.0–12.0)
Neutro Abs: 4.9 10*3/uL (ref 1.4–7.7)
Neutrophils Relative %: 54.8 % (ref 43.0–77.0)
Platelets: 305 10*3/uL (ref 150.0–400.0)
RBC: 4.86 Mil/uL (ref 4.22–5.81)
RDW: 12.9 % (ref 11.5–15.5)
WBC: 8.9 10*3/uL (ref 4.0–10.5)

## 2023-02-17 LAB — URINALYSIS, ROUTINE W REFLEX MICROSCOPIC
Bilirubin Urine: NEGATIVE
Hgb urine dipstick: NEGATIVE
Ketones, ur: NEGATIVE
Leukocytes,Ua: NEGATIVE
Nitrite: NEGATIVE
Specific Gravity, Urine: 1.02 (ref 1.000–1.030)
Total Protein, Urine: NEGATIVE
Urine Glucose: NEGATIVE
Urobilinogen, UA: 0.2 (ref 0.0–1.0)
pH: 6 (ref 5.0–8.0)

## 2023-02-17 LAB — HEPATIC FUNCTION PANEL
ALT: 30 U/L (ref 0–53)
AST: 23 U/L (ref 0–37)
Albumin: 4.5 g/dL (ref 3.5–5.2)
Alkaline Phosphatase: 76 U/L (ref 39–117)
Bilirubin, Direct: 0.1 mg/dL (ref 0.0–0.3)
Total Bilirubin: 0.8 mg/dL (ref 0.2–1.2)
Total Protein: 7.5 g/dL (ref 6.0–8.3)

## 2023-02-17 LAB — LIPID PANEL
Cholesterol: 144 mg/dL (ref 0–200)
HDL: 32.8 mg/dL — ABNORMAL LOW (ref >39.00)
LDL Cholesterol: 86 mg/dL (ref 0–99)
NonHDL: 111.26
Total CHOL/HDL Ratio: 4
Triglycerides: 125 mg/dL (ref 0.0–149.0)
VLDL: 25 mg/dL (ref 0.0–40.0)

## 2023-02-17 LAB — BASIC METABOLIC PANEL
BUN: 16 mg/dL (ref 6–23)
CO2: 30 mEq/L (ref 19–32)
Calcium: 10.1 mg/dL (ref 8.4–10.5)
Chloride: 102 mEq/L (ref 96–112)
Creatinine, Ser: 1.35 mg/dL (ref 0.40–1.50)
GFR: 54.94 mL/min — ABNORMAL LOW (ref >60.00)
Glucose, Bld: 89 mg/dL (ref 70–99)
Potassium: 4.8 mEq/L (ref 3.5–5.1)
Sodium: 141 mEq/L (ref 135–145)

## 2023-02-17 LAB — VITAMIN D 25 HYDROXY (VIT D DEFICIENCY, FRACTURES): VITD: 44.33 ng/mL (ref 30.00–100.00)

## 2023-02-17 LAB — VITAMIN B12: Vitamin B-12: 345 pg/mL (ref 211–911)

## 2023-02-17 LAB — PSA: PSA: 2.11 ng/mL (ref 0.10–4.00)

## 2023-02-17 LAB — HEMOGLOBIN A1C: Hgb A1c MFr Bld: 5.9 % (ref 4.6–6.5)

## 2023-02-17 LAB — TSH: TSH: 1.5 u[IU]/mL (ref 0.35–5.50)

## 2023-03-02 ENCOUNTER — Ambulatory Visit (INDEPENDENT_AMBULATORY_CARE_PROVIDER_SITE_OTHER): Payer: Federal, State, Local not specified - PPO | Admitting: Internal Medicine

## 2023-03-02 ENCOUNTER — Ambulatory Visit (INDEPENDENT_AMBULATORY_CARE_PROVIDER_SITE_OTHER): Payer: Federal, State, Local not specified - PPO

## 2023-03-02 ENCOUNTER — Encounter: Payer: Self-pay | Admitting: Internal Medicine

## 2023-03-02 VITALS — BP 130/75 | HR 58 | Temp 98.0°F | Ht 67.0 in | Wt 183.0 lb

## 2023-03-02 VITALS — BP 130/75 | HR 58 | Temp 98.4°F | Ht 67.0 in | Wt 183.6 lb

## 2023-03-02 DIAGNOSIS — R002 Palpitations: Secondary | ICD-10-CM

## 2023-03-02 DIAGNOSIS — Z0001 Encounter for general adult medical examination with abnormal findings: Secondary | ICD-10-CM | POA: Diagnosis not present

## 2023-03-02 DIAGNOSIS — S61031A Puncture wound without foreign body of right thumb without damage to nail, initial encounter: Secondary | ICD-10-CM | POA: Diagnosis not present

## 2023-03-02 DIAGNOSIS — Z23 Encounter for immunization: Secondary | ICD-10-CM

## 2023-03-02 DIAGNOSIS — Z0000 Encounter for general adult medical examination without abnormal findings: Secondary | ICD-10-CM

## 2023-03-02 DIAGNOSIS — M25562 Pain in left knee: Secondary | ICD-10-CM

## 2023-03-02 DIAGNOSIS — E7800 Pure hypercholesterolemia, unspecified: Secondary | ICD-10-CM

## 2023-03-02 DIAGNOSIS — E538 Deficiency of other specified B group vitamins: Secondary | ICD-10-CM

## 2023-03-02 DIAGNOSIS — M25561 Pain in right knee: Secondary | ICD-10-CM

## 2023-03-02 DIAGNOSIS — R739 Hyperglycemia, unspecified: Secondary | ICD-10-CM

## 2023-03-02 DIAGNOSIS — Z125 Encounter for screening for malignant neoplasm of prostate: Secondary | ICD-10-CM | POA: Diagnosis not present

## 2023-03-02 DIAGNOSIS — E559 Vitamin D deficiency, unspecified: Secondary | ICD-10-CM

## 2023-03-02 DIAGNOSIS — I10 Essential (primary) hypertension: Secondary | ICD-10-CM

## 2023-03-02 DIAGNOSIS — G8929 Other chronic pain: Secondary | ICD-10-CM

## 2023-03-02 MED ORDER — METOPROLOL TARTRATE 50 MG PO TABS: 50.0000 mg | ORAL_TABLET | Freq: Two times a day (BID) | ORAL | 3 refills | Status: DC

## 2023-03-02 MED ORDER — ATORVASTATIN CALCIUM 20 MG PO TABS: 20.0000 mg | ORAL_TABLET | Freq: Every day | ORAL | 3 refills | Status: DC

## 2023-03-02 MED ORDER — HYDROCHLOROTHIAZIDE 25 MG PO TABS: 25.0000 mg | ORAL_TABLET | Freq: Every day | ORAL | 3 refills | Status: DC

## 2023-03-02 NOTE — Progress Notes (Signed)
Patient ID: Martin Walker, male   DOB: 03-12-1956, 67 y.o.   MRN: [id number]         Chief Complaint:: wellness exam and Annual Exam (Discuss labs, would like to change cholesterol meds without statin to be able to continue ice hockey)  , right thumb puncture recent, hld, bilateral knee pain, htn, hyperglycemia       HPI:  Martin Walker is a 67 y.o. male here for wellness exam; for Tdap today, then prevnar 20 in 2 wks here, declines covid booster, o/w up to date                Also did have a covid like illness 1 mo ago with fatigue, cough, sob and diarrhea, lack of taste but now essentially resolved except for minor fatigue and non prod cough.  Getting better, he has been trying to exercise to try get in better shape to be able to play hockey, but has had signifiicant bilateral knee pain especially with full flexion of the knees and falling on them to the knees despite padding; wants to blame the statin on the associated pain after reading on the internet.  Also incidentally had 3 days ago right thumb puncture wound small with a nail, but fortunately no redness, swelling after.  Wt Readings from Last 3 Encounters:  03/02/23 183 lb (83 kg)  03/02/23 183 lb 9.6 oz (83.3 kg)  11/02/22 186 lb (84.4 kg)   BP Readings from Last 3 Encounters:  03/02/23 130/75  03/02/23 130/75  11/02/22 (!) 170/90   Immunization History  Administered Date(s) Administered   Influenza Split 10/06/2011, 09/20/2012   Influenza, High Dose Seasonal PF 10/10/2013   Influenza,inj,Quad PF,6+ Mos 11/04/2014, 10/28/2015, 10/06/2016, 10/11/2017, 10/11/2018, 10/03/2019, 10/01/2020, 09/22/2021, 09/15/2022   PFIZER Comirnaty(Gray Top)Covid-19 Tri-Sucrose Vaccine 07/07/2021   PFIZER(Purple Top)SARS-COV-2 Vaccination 04/10/2020, 05/06/2020, 11/27/2020   Tdap 10/22/2012, 03/02/2023   Zoster Recombinat (Shingrix) 11/02/2022, 02/09/2023   Health Maintenance Due  Topic Date Due   Pneumonia Vaccine 60+ Years old (1 of 2  - PCV) Never done   COVID-19 Vaccine (5 - 2023-24 season) 08/26/2022      Past Medical History:  Diagnosis Date   Allergy    seasonal allerties   Arthritis    Asthma    chilhood   Hyperlipidemia    Hypertension    Irregular heart beat    on medication.   Rhinitis, allergic    skin test pos 11/26/08- dust,cat   Rhinosinusitis    recurrent   Past Surgical History:  Procedure Laterality Date   COLONOSCOPY     left knee     right wrist     ROTATOR CUFF REPAIR     left    reports that he quit smoking about 11 years ago. His smoking use included cigarettes. He has a 52.50 pack-year smoking history. He has quit using smokeless tobacco. He reports current alcohol use. He reports that he does not currently use drugs after having used the following drugs: Marijuana. family history includes Colon cancer in an other family member; Heart disease in his father; Hypertension in his mother; Lung cancer in an other family member; Other in an other family member; Thyroid disease in his mother. Allergies  Allergen Reactions   Codeine     Causes peeling of the hands   Current Outpatient Medications on File Prior to Visit  Medication Sig Dispense Refill   ALREX 0.2 % SUSP Place 2 drops into both eyes as needed.  As directed     aspirin EC 81 MG tablet Take 1 tablet (81 mg total) by mouth daily. 90 tablet 11   fluorouracil (EFUDEX) 5 % cream Apply topically 2 (two) times daily.     fluticasone (FLONASE) 50 MCG/ACT nasal spray Use 2 spray(s) in each nostril once daily 48 g 0   hydrocortisone (ANUSOL-HC) 25 MG suppository Place 1 suppository (25 mg total) rectally at bedtime as needed for hemorrhoids or anal itching. 12 suppository 3   hydrocortisone 2.5 % cream Apply topically 2 (two) times a week.     ketoconazole (NIZORAL) 2 % cream Apply topically.     meloxicam (MOBIC) 15 MG tablet Take 1 tablet (15 mg total) by mouth daily as needed for pain. 90 tablet 1   VITAMIN D PO Take by mouth.      No current facility-administered medications on file prior to visit.        ROS:  All others reviewed and negative.  Objective        PE:  BP 130/75   Pulse (!) 58   Temp 98 F (36.7 C) (Oral)   Ht '5\' 7"'$  (1.702 m)   Wt 183 lb (83 kg)   SpO2 97%   BMI 28.66 kg/m                 Constitutional: Pt appears in NAD               HENT: Head: NCAT.                Right Ear: External ear normal.                 Left Ear: External ear normal.                Eyes: . Pupils are equal, round, and reactive to light. Conjunctivae and EOM are normal               Nose: without d/c or deformity               Neck: Neck supple. Gross normal ROM               Cardiovascular: Normal rate and regular rhythm.                 Pulmonary/Chest: Effort normal and breath sounds without rales or wheezing.                Abd:  Soft, NT, ND, + BS, no organomegaly               Neurological: Pt is alert. At baseline orientation, motor grossly intact               Skin: Skin is warm. No rashes, no other new lesions, right thumb without obvious puncture or redness or swelling,, LE edema - none               Psychiatric: Pt behavior is normal without agitation   Micro: none  Cardiac tracings I have personally interpreted today:  none  Pertinent Radiological findings (summarize): none   Lab Results  Component Value Date   WBC 8.9 02/17/2023   HGB 15.3 02/17/2023   HCT 45.1 02/17/2023   PLT 305.0 02/17/2023   GLUCOSE 89 02/17/2023   CHOL 144 02/17/2023   TRIG 125.0 02/17/2023   HDL 32.80 (L) 02/17/2023   LDLDIRECT 167.4 12/11/2013   LDLCALC 86 02/17/2023   ALT 30  02/17/2023   AST 23 02/17/2023   NA 141 02/17/2023   K 4.8 02/17/2023   CL 102 02/17/2023   CREATININE 1.35 02/17/2023   BUN 16 02/17/2023   CO2 30 02/17/2023   TSH 1.50 02/17/2023   PSA 2.11 02/17/2023   HGBA1C 5.9 02/17/2023   MICROALBUR 1.0 12/14/2016   Assessment/Plan:  Martin Walker is a 67 y.o. White or Caucasian  [1] male with  has a past medical history of Allergy, Arthritis, Asthma, Hyperlipidemia, Hypertension, Irregular heart beat, Rhinitis, allergic, and Rhinosinusitis.  Encounter for well adult exam with abnormal findings Age and sex appropriate education and counseling updated with regular exercise and diet Referrals for preventative services - none needed Immunizations addressed - for tdap today given recent puncture wound, then prevnar 14 in 2wks nurse visit, declines covid booster Smoking counseling  - none needed Evidence for depression or other mood disorder - none significant Most recent labs reviewed. I have personally reviewed and have noted: 1) the patient's medical and social history 2) The patient's current medications and supplements 3) The patient's height, weight, and BMI have been recorded in the chart   HTN (hypertension) BP Readings from Last 3 Encounters:  03/02/23 130/75  03/02/23 130/75  11/02/22 (!) 170/90   Stable, pt to continue medical treatment hct 25 qd, lopressor 50 bid   Hyperglycemia Lab Results  Component Value Date   HGBA1C 5.9 02/17/2023   Stable, pt to continue current medical treatment  - diet, wt control   Hyperlipidemia Lab Results  Component Value Date   LDLCALC 86 02/17/2023   Stable, pt to continue current statin lipitor 20 mg qd except given pt concerns today is ok for 2 wk holiday, but pt advised if knee pain not improved with this, he should restart lipitor   Vitamin D deficiency Last vitamin D Lab Results  Component Value Date   VD25OH 44.33 02/17/2023   Stable, cont oral replacement   Puncture wound of right thumb Mild at worst, no s/s infection, for tdap today  Bilateral knee pain C/w djd over a lifetime of hockey playing and other sports - I suspect this is source of his pain with falling on knees to the ice rather than the statin, but he will need to be convinced; pt should f/u with sports medicine for persistence or  worsening  Followup: Return in about 1 year (around 03/01/2024).  Cathlean Cower, MD 03/04/2023 8:07 AM [facility]

## 2023-03-02 NOTE — Patient Instructions (Signed)
Mr. Martin Walker , Thank you for taking time to come for your Medicare Wellness Visit. I appreciate your ongoing commitment to your health goals. Please review the following plan we discussed and let me know if I can assist you in the future.   These are the goals we discussed:  Goals      My goal for 2024 is to keep my cholesterol numbers in normal range and stay physically active.     patient heard information shared about Care Coordination program. Did not state a goal at this time     Care Coordination Interventions:   Active listening / Reflection utilized  Informed  client about Care Coordination program support  Patient did not state a goal at this time. Patient did not indicate interest in program        This is a list of the screening recommended for you and due dates:  Health Maintenance  Topic Date Due   Pneumonia Vaccine (1 of 2 - PCV) Never done   COVID-19 Vaccine (5 - 2023-24 season) 08/26/2022   DTaP/Tdap/Td vaccine (2 - Td or Tdap) 10/22/2022   Medicare Annual Wellness Visit  03/01/2024   Colon Cancer Screening  09/15/2028   Flu Shot  Completed   Hepatitis C Screening: USPSTF Recommendation to screen - Ages 18-79 yo.  Completed   HIV Screening  Completed   Zoster (Shingles) Vaccine  Completed   HPV Vaccine  Aged Out   Screening for Lung Cancer  Discontinued    Advanced directives: No; Advance directive discussed with you today. Even though you declined this today please call our office should you change your mind and we can give you the proper paperwork for you to fill out.  Conditions/risks identified: Yes  Next appointment: Follow up in one year for your annual wellness visit.   Preventive Care 39 Years and Older, Male  Preventive care refers to lifestyle choices and visits with your health care provider that can promote health and wellness. What does preventive care include? A yearly physical exam. This is also called an annual well check. Dental exams once  or twice a year. Routine eye exams. Ask your health care provider how often you should have your eyes checked. Personal lifestyle choices, including: Daily care of your teeth and gums. Regular physical activity. Eating a healthy diet. Avoiding tobacco and drug use. Limiting alcohol use. Practicing safe sex. Taking low doses of aspirin every day. Taking vitamin and mineral supplements as recommended by your health care provider. What happens during an annual well check? The services and screenings done by your health care provider during your annual well check will depend on your age, overall health, lifestyle risk factors, and family history of disease. Counseling  Your health care provider may ask you questions about your: Alcohol use. Tobacco use. Drug use. Emotional well-being. Home and relationship well-being. Sexual activity. Eating habits. History of falls. Memory and ability to understand (cognition). Work and work Statistician. Screening  You may have the following tests or measurements: Height, weight, and BMI. Blood pressure. Lipid and cholesterol levels. These may be checked every 5 years, or more frequently if you are over 48 years old. Skin check. Lung cancer screening. You may have this screening every year starting at age 65 if you have a 30-pack-year history of smoking and currently smoke or have quit within the past 15 years. Fecal occult blood test (FOBT) of the stool. You may have this test every year starting at age 38.  Flexible sigmoidoscopy or colonoscopy. You may have a sigmoidoscopy every 5 years or a colonoscopy every 10 years starting at age 18. Prostate cancer screening. Recommendations will vary depending on your family history and other risks. Hepatitis C blood test. Hepatitis B blood test. Sexually transmitted disease (STD) testing. Diabetes screening. This is done by checking your blood sugar (glucose) after you have not eaten for a while (fasting).  You may have this done every 1-3 years. Abdominal aortic aneurysm (AAA) screening. You may need this if you are a current or former smoker. Osteoporosis. You may be screened starting at age 57 if you are at high risk. Talk with your health care provider about your test results, treatment options, and if necessary, the need for more tests. Vaccines  Your health care provider may recommend certain vaccines, such as: Influenza vaccine. This is recommended every year. Tetanus, diphtheria, and acellular pertussis (Tdap, Td) vaccine. You may need a Td booster every 10 years. Zoster vaccine. You may need this after age 47. Pneumococcal 13-valent conjugate (PCV13) vaccine. One dose is recommended after age 38. Pneumococcal polysaccharide (PPSV23) vaccine. One dose is recommended after age 82. Talk to your health care provider about which screenings and vaccines you need and how often you need them. This information is not intended to replace advice given to you by your health care provider. Make sure you discuss any questions you have with your health care provider. Document Released: 01/08/2016 Document Revised: 08/31/2016 Document Reviewed: 10/13/2015 Elsevier Interactive Patient Education  2017 [place] Prevention in the Home Falls can cause injuries. They can happen to people of all ages. There are many things you can do to make your home safe and to help prevent falls. What can I do on the outside of my home? Regularly fix the edges of walkways and driveways and fix any cracks. Remove anything that might make you trip as you walk through a door, such as a raised step or threshold. Trim any bushes or trees on the path to your home. Use bright outdoor lighting. Clear any walking paths of anything that might make someone trip, such as rocks or tools. Regularly check to see if handrails are loose or broken. Make sure that both sides of any steps have handrails. Any raised decks and  porches should have guardrails on the edges. Have any leaves, snow, or ice cleared regularly. Use sand or salt on walking paths during winter. Clean up any spills in your garage right away. This includes oil or grease spills. What can I do in the bathroom? Use night lights. Install grab bars by the toilet and in the tub and shower. Do not use towel bars as grab bars. Use non-skid mats or decals in the tub or shower. If you need to sit down in the shower, use a plastic, non-slip stool. Keep the floor dry. Clean up any water that spills on the floor as soon as it happens. Remove soap buildup in the tub or shower regularly. Attach bath mats securely with double-sided non-slip rug tape. Do not have throw rugs and other things on the floor that can make you trip. What can I do in the bedroom? Use night lights. Make sure that you have a light by your bed that is easy to reach. Do not use any sheets or blankets that are too big for your bed. They should not hang down onto the floor. Have a firm chair that has side arms. You can use this  for support while you get dressed. Do not have throw rugs and other things on the floor that can make you trip. What can I do in the kitchen? Clean up any spills right away. Avoid walking on wet floors. Keep items that you use a lot in easy-to-reach places. If you need to reach something above you, use a strong step stool that has a grab bar. Keep electrical cords out of the way. Do not use floor polish or wax that makes floors slippery. If you must use wax, use non-skid floor wax. Do not have throw rugs and other things on the floor that can make you trip. What can I do with my stairs? Do not leave any items on the stairs. Make sure that there are handrails on both sides of the stairs and use them. Fix handrails that are broken or loose. Make sure that handrails are as long as the stairways. Check any carpeting to make sure that it is firmly attached to the  stairs. Fix any carpet that is loose or worn. Avoid having throw rugs at the top or bottom of the stairs. If you do have throw rugs, attach them to the floor with carpet tape. Make sure that you have a light switch at the top of the stairs and the bottom of the stairs. If you do not have them, ask someone to add them for you. What else can I do to help prevent falls? Wear shoes that: Do not have high heels. Have rubber bottoms. Are comfortable and fit you well. Are closed at the toe. Do not wear sandals. If you use a stepladder: Make sure that it is fully opened. Do not climb a closed stepladder. Make sure that both sides of the stepladder are locked into place. Ask someone to hold it for you, if possible. Clearly mark and make sure that you can see: Any grab bars or handrails. First and last steps. Where the edge of each step is. Use tools that help you move around (mobility aids) if they are needed. These include: Canes. Walkers. Scooters. Crutches. Turn on the lights when you go into a dark area. Replace any light bulbs as soon as they burn out. Set up your furniture so you have a clear path. Avoid moving your furniture around. If any of your floors are uneven, fix them. If there are any pets around you, be aware of where they are. Review your medicines with your doctor. Some medicines can make you feel dizzy. This can increase your chance of falling. Ask your doctor what other things that you can do to help prevent falls. This information is not intended to replace advice given to you by your health care provider. Make sure you discuss any questions you have with your health care provider. Document Released: 10/08/2009 Document Revised: 05/19/2016 Document Reviewed: 01/16/2015 Elsevier Interactive Patient Education  2017 [employer].

## 2023-03-02 NOTE — Patient Instructions (Addendum)
You had the Tdap tetanus shot today  Please return in 2 weeks for a Nurse Visit for Prevnar 20 pneumonia shot  Ok to hold the statin for 2 wks to see if the knees are better with exercise, then restart if not  Please continue all other medications as before, and refills have been done if requested.  Please have the pharmacy call with any other refills you may need.  Please continue your efforts at being more active, low cholesterol diet, and weight control.  You are otherwise up to date with prevention measures today.  Please keep your appointments with your specialists as you may have planned  Please make an Appointment to return for your 1 year visit, or sooner if needed, with Lab testing by Appointment as well, to be done about 3-5 days before at the [facility] (so this is for TWO appointments - please see the scheduling desk as you leave)

## 2023-03-02 NOTE — Progress Notes (Addendum)
Subjective:   Jermell Greenwell is a 67 y.o. male who presents for an Initial Medicare Annual Wellness Visit.  Patient Medicare AWV questionnaire was completed by the patient on 02/26/2023; I have confirmed that all information answered by patient is correct and no changes since this date.     Review of Systems     Cardiac Risk Factors include: advanced age (>40mn, >>21women);dyslipidemia;hypertension;male gender;family history of premature cardiovascular disease     Objective:    Today's Vitals   03/02/23 1334 03/02/23 1357  BP: 130/75   Pulse: (!) 58   Temp: 98.4 F (36.9 C)   TempSrc: Temporal   SpO2: 97%   Weight: 183 lb 9.6 oz (83.3 kg)   Height: '5\' 7"'$  (1.702 m)   PainSc: 0-No pain 0-No pain   Body mass index is 28.76 kg/m.     03/02/2023    2:01 PM  Advanced Directives  Does Patient Have a Medical Advance Directive? No  Would patient like information on creating a medical advance directive? No - Patient declined    Current Medications (verified) Outpatient Encounter Medications as of 03/02/2023  Medication Sig   ALREX 0.2 % SUSP Place 2 drops into both eyes as needed. As directed   aspirin EC 81 MG tablet Take 1 tablet (81 mg total) by mouth daily.   atorvastatin (LIPITOR) 20 MG tablet Take 1 tablet by mouth once daily   fluorouracil (EFUDEX) 5 % cream Apply topically 2 (two) times daily.   fluticasone (FLONASE) 50 MCG/ACT nasal spray Use 2 spray(s) in each nostril once daily   hydrochlorothiazide (HYDRODIURIL) 25 MG tablet Take 1 tablet by mouth once daily   hydrocortisone (ANUSOL-HC) 25 MG suppository Place 1 suppository (25 mg total) rectally at bedtime as needed for hemorrhoids or anal itching.   hydrocortisone 2.5 % cream Apply topically 2 (two) times a week.   ketoconazole (NIZORAL) 2 % cream Apply topically.   meloxicam (MOBIC) 15 MG tablet Take 1 tablet (15 mg total) by mouth daily as needed for pain.   metoprolol tartrate (LOPRESSOR) 50 MG tablet Take  1 tablet (50 mg total) by mouth 2 (two) times daily.   VITAMIN D PO Take by mouth.   No facility-administered encounter medications on file as of 03/02/2023.    Allergies (verified) Codeine   History: Past Medical History:  Diagnosis Date   Allergy    seasonal allerties   Arthritis    Asthma    chilhood   Hyperlipidemia    Hypertension    Irregular heart beat    on medication.   Rhinitis, allergic    skin test pos 11/26/08- dust,cat   Rhinosinusitis    recurrent   Past Surgical History:  Procedure Laterality Date   COLONOSCOPY     left knee     right wrist     ROTATOR CUFF REPAIR     left   Family History  Problem Relation Age of Onset   Hypertension Mother    Thyroid disease Mother    Heart disease Father    Lung cancer Other        GRANDFATHER   Colon cancer Other        UNCLE   Other Other        GRANDMOTHER WITH BRAIN TUMOR   Colon polyps Neg Hx    Esophageal cancer Neg Hx    Rectal cancer Neg Hx    Stomach cancer Neg Hx    Social History  Socioeconomic History   Marital status: Married    Spouse name: Not on file   Number of children: Not on file   Years of education: Not on file   Highest education level: Not on file  Occupational History   Occupation: Post [employer]  Tobacco Use   Smoking status: Former    Packs/day: 1.50    Years: 35.00    Total pack years: 52.50    Types: Cigarettes    Quit date: 04/24/2011    Years since quitting: 11.8   Smokeless tobacco: Former   Tobacco comments:    quit in early 20's  Vaping Use   Vaping Use: Never used  Substance and Sexual Activity   Alcohol use: Yes    Comment: 6 beers/ year   Drug use: Not Currently    Types: Marijuana    Comment: age 41 - 68 very little   Sexual activity: Not on file  Other Topics Concern   Not on file  Social History Narrative   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       Social Determinants of Health    Financial Resource Strain: Low Risk  (03/02/2023)   Overall Financial Resource Strain (CARDIA)    Difficulty of Paying Living Expenses: Not hard at all  Food Insecurity: No Food Insecurity (03/02/2023)   Hunger Vital Sign    Worried About Running Out of Food in the Last Year: Never true    Ran Out of Food in the Last Year: Never true  Transportation Needs: No Transportation Needs (03/02/2023)   PRAPARE - Hydrologist (Medical): No    Lack of Transportation (Non-Medical): No  Physical Activity: Sufficiently Active (03/02/2023)   Exercise Vital Sign    Days of Exercise per Week: 5 days    Minutes of Exercise per Session: 90 min  Stress: No Stress Concern Present (03/02/2023)   [place]    Feeling of Stress : Only a little  Social Connections: Unknown (03/02/2023)   Social Connection and Isolation Panel [NHANES]    Frequency of Communication with Friends and Family: More than three times a week    Frequency of Social Gatherings with Friends and Family: More than three times a week    Attends Religious Services: Not on Advertising copywriter or Organizations: No    Attends Music therapist: Not on file    Marital Status: Married    Tobacco Counseling Counseling given: Not Answered Tobacco comments: quit in early 20's   Clinical Intake:  Pre-visit preparation completed: Yes  Pain : No/denies pain Pain Score: 0-No pain     BMI - recorded: 28.76 Nutritional Status: BMI 25 -29 Overweight Nutritional Risks: None Diabetes: No  How often do you need to have someone help you when you read instructions, pamphlets, or other written materials from your doctor or pharmacy?: 1 - Never What is the last grade level you completed in school?: HSG; Retired from [employer]  Diabetic? No  Interpreter Needed?: No  Information entered by :: Lisette Abu,  LPN.   Activities of Daily Living    03/02/2023    2:02 PM 02/26/2023    9:18 AM  In your present state of health, do you have any difficulty performing the following activities:  Hearing? 1 1  Vision? 0 0  Difficulty concentrating or making decisions?  0 0  Walking or climbing stairs? 1 1  Dressing or bathing? 0 0  Doing errands, shopping? 0 0  Preparing Food and eating ? N N  Using the Toilet? N N  In the past six months, have you accidently leaked urine? N N  Do you have problems with loss of bowel control? N N  Managing your Medications? N N  Managing your Finances? N N  Housekeeping or managing your Housekeeping? N N    Patient Care Team: Biagio Borg, MD as PCP - Cheral Bay, MD as PCP - Cardiology (Cardiology)  Indicate any recent Medical Services you may have received from other than [facility] providers in the past year (date may be approximate).     Assessment:   This is a routine wellness examination for [place].  Hearing/Vision screen Hearing Screening - Comments:: Patient has hearing difficulties.  Patient does not wears his hearing aids. Vision Screening - Comments:: .Wears rx glasses - up to date with routine eye exams with [place] issues and exercise activities discussed: Current Exercise Habits: Home exercise routine;Structured exercise class, Time (Minutes): > 60 (90 minutes), Frequency (Times/Week): 5, Weekly Exercise (Minutes/Week): 0, Intensity: Moderate, Exercise limited by: orthopedic condition(s)   Goals Addressed             This Visit's Progress    My goal for 2024 is to keep my cholesterol numbers in normal range and stay physically active.        Depression Screen    03/02/2023    1:59 PM 01/31/2022    2:03 PM 04/06/2021    1:21 PM 01/28/2021    2:58 PM 01/28/2021    2:57 PM 12/27/2018    2:45 PM 12/21/2017   12:00 PM  PHQ 2/9 Scores  PHQ - 2 Score 0 0 0 0 0 0 0  PHQ- 9 Score 0   0   0    Fall Risk    03/02/2023     2:02 PM 02/26/2023    9:18 AM 01/31/2022    2:02 PM 04/06/2021    1:21 PM 12/30/2019    2:22 PM  [place] in the past year?  1 0 1 0  Number falls in past yr: 1 1 0 0   Injury with Fall? 0 0 0 1   Follow up Falls evaluation completed        FALL RISK PREVENTION PERTAINING TO THE HOME:  Any stairs in or around the home? No  If so, are there any without handrails? No  Home free of loose throw rugs in walkways, pet beds, electrical cords, etc? Yes  Adequate lighting in your home to reduce risk of falls? Yes   ASSISTIVE DEVICES UTILIZED TO PREVENT FALLS:  Life alert? No  Use of a cane, walker or w/c? No  Grab bars in the bathroom? No  Shower chair or bench in shower? No  Elevated toilet seat or a handicapped toilet? No   TIMED UP AND GO:  Was the test performed? Yes .  Length of time to ambulate 10 feet: 8 sec.   Gait steady and fast without use of assistive device  Cognitive Function:        03/02/2023    2:03 PM  6CIT Screen  What Year? 0 points  What month? 0 points  What time? 0 points  Count back from 20 0 points  Months in reverse 0 points  Repeat  phrase 0 points  Total Score 0 points    Immunizations Immunization History  Administered Date(s) Administered   Influenza Split 10/06/2011, 09/20/2012   Influenza, High Dose Seasonal PF 10/10/2013   Influenza,inj,Quad PF,6+ Mos 11/04/2014, 10/28/2015, 10/06/2016, 10/11/2017, 10/11/2018, 10/03/2019, 10/01/2020, 09/22/2021, 09/15/2022   PFIZER Comirnaty(Gray Top)Covid-19 Tri-Sucrose Vaccine 07/07/2021   PFIZER(Purple Top)SARS-COV-2 Vaccination 04/10/2020, 05/06/2020, 11/27/2020   Tdap 10/22/2012   Zoster Recombinat (Shingrix) 11/02/2022, 02/09/2023    TDAP status: Due, Education has been provided regarding the importance of this vaccine. Advised may receive this vaccine at local pharmacy or Health Dept. Aware to provide a copy of the vaccination record if obtained from local pharmacy or Health Dept.  Verbalized acceptance and understanding.  Flu Vaccine status: Up to date  Pneumococcal vaccine status: Due, Education has been provided regarding the importance of this vaccine. Advised may receive this vaccine at local pharmacy or Health Dept. Aware to provide a copy of the vaccination record if obtained from local pharmacy or Health Dept. Verbalized acceptance and understanding.  Covid-19 vaccine status: Completed vaccines  Qualifies for Shingles Vaccine? Yes   Zostavax completed No   Shingrix Completed?: Yes  Screening Tests Health Maintenance  Topic Date Due   Lung Cancer Screening  01/18/2009   Pneumonia Vaccine 50+ Years old (1 of 1 - PCV) Never done   COVID-19 Vaccine (5 - 2023-24 season) 08/26/2022   DTaP/Tdap/Td (2 - Td or Tdap) 10/22/2022   Medicare Annual Wellness (AWV)  03/01/2024   COLONOSCOPY (Pts 45-43yr Insurance coverage will need to be confirmed)  09/15/2028   INFLUENZA VACCINE  Completed   Hepatitis C Screening  Completed   HIV Screening  Completed   Zoster Vaccines- Shingrix  Completed   HPV VACCINES  Aged Out    Health Maintenance  Health Maintenance Due  Topic Date Due   Lung Cancer Screening  01/18/2009   Pneumonia Vaccine 66 Years old (1 of 1 - PCV) Never done   COVID-19 Vaccine (5 - 2023-24 season) 08/26/2022   DTaP/Tdap/Td (2 - Td or Tdap) 10/22/2022    Colorectal cancer screening: Type of screening: Colonoscopy. Completed 09/15/2021. Repeat every 7 years  Lung Cancer Screening: (Low Dose CT Chest recommended if Age 66-80years, 30 pack-year currently smoking OR have quit w/in 15years.) does not qualify.   Lung Cancer Screening Referral: no  Additional Screening:  Hepatitis C Screening: does qualify; Completed 07/13/2016  Vision Screening: Recommended annual ophthalmology exams for early detection of glaucoma and other disorders of the eye. Is the patient up to date with their annual eye exam?  Yes  Who is the provider or what is the name of  the office in which the patient attends annual eye exams[place] is not established with a provider, would they like to be referred to a provider to establish care? No .   Dental Screening: Recommended annual dental exams for proper oral hygiene  Community Resource Referral / Chronic Care Management: CRR required this visit?  No   CCM required this visit?  No      Plan:     I have personally reviewed and noted the following in the patient's chart:   Medical and social history Use of alcohol, tobacco or illicit drugs  Current medications and supplements including opioid prescriptions. Patient is not currently taking opioid prescriptions. Functional ability and status Nutritional status Physical activity Advanced directives List of other physicians Hospitalizations, surgeries, and ER visits in previous 12 months Vitals Screenings to  include cognitive, depression, and falls Referrals and appointments  In addition, I have reviewed and discussed with patient certain preventive protocols, quality metrics, and best practice recommendations. A written personalized care plan for preventive services as well as general preventive health recommendations were provided to patient.     Sheral Flow, LPN   [id number]   Nurse Notes: Normal cognitive status assessed by direct observation by this Nurse Health Advisor. No abnormalities found.   Patient Medicare AWV questionnaire was completed by the patient on 02/26/2023; I have confirmed that all information answered by patient is correct and no changes since this date.

## 2023-03-04 ENCOUNTER — Encounter: Payer: Self-pay | Admitting: Internal Medicine

## 2023-03-04 DIAGNOSIS — S61031A Puncture wound without foreign body of right thumb without damage to nail, initial encounter: Secondary | ICD-10-CM | POA: Insufficient documentation

## 2023-03-04 DIAGNOSIS — M25561 Pain in right knee: Secondary | ICD-10-CM | POA: Insufficient documentation

## 2023-03-04 NOTE — Assessment & Plan Note (Signed)
BP Readings from Last 3 Encounters:  03/02/23 130/75  03/02/23 130/75  11/02/22 (!) 170/90   Stable, pt to continue medical treatment hct 25 qd, lopressor 50 bid

## 2023-03-04 NOTE — Assessment & Plan Note (Signed)
Age and sex appropriate education and counseling updated with regular exercise and diet Referrals for preventative services - none needed Immunizations addressed - for tdap today given recent puncture wound, then prevnar 61 in 2wks nurse visit, declines covid booster Smoking counseling  - none needed Evidence for depression or other mood disorder - none significant Most recent labs reviewed. I have personally reviewed and have noted: 1) the patient's medical and social history 2) The patient's current medications and supplements 3) The patient's height, weight, and BMI have been recorded in the chart

## 2023-03-04 NOTE — Assessment & Plan Note (Signed)
Lab Results  Component Value Date   HGBA1C 5.9 02/17/2023   Stable, pt to continue current medical treatment  - diet, wt control

## 2023-03-04 NOTE — Assessment & Plan Note (Signed)
Last vitamin D Lab Results  Component Value Date   VD25OH 44.33 02/17/2023   Stable, cont oral replacement

## 2023-03-04 NOTE — Assessment & Plan Note (Signed)
Mild at worst, no s/s infection, for tdap today

## 2023-03-04 NOTE — Assessment & Plan Note (Addendum)
Lab Results  Component Value Date   LDLCALC 86 02/17/2023   Stable, pt to continue current statin lipitor 20 mg qd except given pt concerns today is ok for 2 wk holiday, but pt advised if knee pain not improved with this, he should restart lipitor

## 2023-03-04 NOTE — Assessment & Plan Note (Signed)
C/w djd over a lifetime of hockey playing and other sports - I suspect this is source of his pain with falling on knees to the ice rather than the statin, but he will need to be convinced; pt should f/u with sports medicine for persistence or worsening

## 2023-03-13 ENCOUNTER — Ambulatory Visit: Payer: Federal, State, Local not specified - PPO | Admitting: Internal Medicine

## 2023-03-13 ENCOUNTER — Ambulatory Visit (INDEPENDENT_AMBULATORY_CARE_PROVIDER_SITE_OTHER): Payer: Federal, State, Local not specified - PPO

## 2023-03-13 DIAGNOSIS — Z23 Encounter for immunization: Secondary | ICD-10-CM | POA: Diagnosis not present

## 2023-03-13 NOTE — Progress Notes (Signed)
After obtaining consent, and per orders of Dr. Jenny Reichmann, injection of Prevnar 20  given by Marrian Salvage. Patient instructed to report any adverse reaction to me immediately.

## 2023-03-16 NOTE — Progress Notes (Signed)
Cardiology Office Note   Date:  03/17/2023   ID:  Martin Walker 08-13-1956, MRN [id number]  PCP:  Biagio Borg, MD  Cardiologist:   Minus Breeding, MD   Chief Complaint  Patient presents with   Heart Murmur      History of Present Illness: Martin Walker is a 67 y.o. male who presents for evaluation of palpitations.  He had some chest pain in 2019.  I sent him for a POET (Plain Old Exercise Treadmill) which was non diagnostic.  Perfusion study was negative for ischemia.  I did hear a murmur at the last visit.  Echo demonstrated no significant abnormalities.  Since he was last seen he has done well.  He had some knee problems so he is not able to ice skate the way he was.  He did help a probably redo his house recently and he is doing a lot of work on meds. The patient denies any new symptoms such as chest discomfort, neck or arm discomfort. There has been no new shortness of breath, PND or orthopnea. There have been no reported palpitations, presyncope or syncope.    Past Medical History:  Diagnosis Date   Allergy    seasonal allerties   Arthritis    Asthma    chilhood   Hyperlipidemia    Hypertension    Irregular heart beat    on medication.   Rhinitis, allergic    skin test pos 11/26/08- dust,cat   Rhinosinusitis    recurrent    Past Surgical History:  Procedure Laterality Date   COLONOSCOPY     left knee     right wrist     ROTATOR CUFF REPAIR     left     Current Outpatient Medications  Medication Sig Dispense Refill   ALREX 0.2 % SUSP Place 2 drops into both eyes as needed. As directed     aspirin EC 81 MG tablet Take 1 tablet (81 mg total) by mouth daily. 90 tablet 11   atorvastatin (LIPITOR) 20 MG tablet Take 1 tablet (20 mg total) by mouth daily. 90 tablet 3   fluorouracil (EFUDEX) 5 % cream Apply topically 2 (two) times daily.     fluticasone (FLONASE) 50 MCG/ACT nasal spray Use 2 spray(s) in each nostril once daily 48 g 0    hydrochlorothiazide (HYDRODIURIL) 25 MG tablet Take 1 tablet (25 mg total) by mouth daily. 90 tablet 3   hydrocortisone (ANUSOL-HC) 25 MG suppository Place 1 suppository (25 mg total) rectally at bedtime as needed for hemorrhoids or anal itching. 12 suppository 3   hydrocortisone 2.5 % cream Apply topically 2 (two) times a week.     ketoconazole (NIZORAL) 2 % cream Apply topically.     meloxicam (MOBIC) 15 MG tablet Take 1 tablet (15 mg total) by mouth daily as needed for pain. 90 tablet 1   metoprolol tartrate (LOPRESSOR) 50 MG tablet Take 1 tablet (50 mg total) by mouth 2 (two) times daily. 180 tablet 3   VITAMIN D PO Take by mouth.     No current facility-administered medications for this visit.    Allergies:   Codeine    ROS:  Please see the history of present illness.   Otherwise, review of systems are positive for none.   All other systems are reviewed and negative.    PHYSICAL EXAM: VS:  BP (!) 144/92   Pulse 61   Ht 5\' 7"  (1.702 m)  Wt 183 lb (83 kg)   BMI 28.66 kg/m  , BMI Body mass index is 28.66 kg/m. GENERAL:  Well appearing NECK:  No jugular venous distention, waveform within normal limits, carotid upstroke brisk and symmetric, no bruits, no thyromegaly LUNGS:  Clear to auscultation bilaterally CHEST:  Unremarkable HEART:  PMI not displaced or sustained,S1 and S2 within normal limits, no S3, no S4, no clicks, no rubs, 2 out of 6 apical systolic murmur radiating at the aortic outflow tract, no diastolic murmurs ABD:  Flat, positive bowel sounds normal in frequency in pitch, no bruits, no rebound, no guarding, no midline pulsatile mass, no hepatomegaly, no splenomegaly EXT:  2 plus pulses throughout, no edema, no cyanosis no clubbing    EKG:  EKG is ordered today. The ekg ordered today demonstrates sinus rhythm, rate 61, axis normal, intervals within normal limits, no acute ST-T wave changes.   Recent Labs: 02/17/2023: ALT 30; BUN 16; Creatinine, Ser 1.35;  Hemoglobin 15.3; Platelets 305.0; Potassium 4.8; Sodium 141; TSH 1.50    Lipid Panel    Component Value Date/Time   CHOL 144 02/17/2023 1022   TRIG 125.0 02/17/2023 1022   HDL 32.80 (L) 02/17/2023 1022   CHOLHDL 4 02/17/2023 1022   VLDL 25.0 02/17/2023 1022   LDLCALC 86 02/17/2023 1022   LDLDIRECT 167.4 12/11/2013 1347      Wt Readings from Last 3 Encounters:  03/17/23 183 lb (83 kg)  03/02/23 183 lb (83 kg)  03/02/23 183 lb 9.6 oz (83.3 kg)      Other studies Reviewed: Additional studies/ records that were reviewed today include: Labs. Review of the above records demonstrates: See elsewhere  ASSESSMENT AND PLAN:  Palpitations -  He has been has episodically but they have not been problematic.  No change in therapy.Marland Kitchen  HTN - His blood pressure is controlled typically at home.  No change in therapy.  He can keep an eye on this since it is slightly elevated today.   Dyslipidemia - His most recent LDL 86 with an HDL of 33.  No change in therapy.   Murmur - No abnormalities on the echo.  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  I do not think it is changed significantly follow this clinically listening to him again next year.  Current medicines are reviewed at length with the patient today.  The patient does not have concerns regarding medicines.  The following changes have been made: None  Labs/ tests ordered today include: None  Orders Placed This Encounter  Procedures   EKG 12-Lead     Disposition:   FU with me in one year.    Signed, Minus Breeding, MD  03/17/2023 4:29 PM    [place]

## 2023-03-17 ENCOUNTER — Ambulatory Visit: Payer: Federal, State, Local not specified - PPO | Attending: Cardiology | Admitting: Cardiology

## 2023-03-17 ENCOUNTER — Encounter: Payer: Self-pay | Admitting: Cardiology

## 2023-03-17 VITALS — BP 144/92 | HR 61 | Ht 67.0 in | Wt 183.0 lb

## 2023-03-17 DIAGNOSIS — I10 Essential (primary) hypertension: Secondary | ICD-10-CM | POA: Diagnosis not present

## 2023-03-17 DIAGNOSIS — R002 Palpitations: Secondary | ICD-10-CM | POA: Diagnosis not present

## 2023-03-17 NOTE — Patient Instructions (Signed)
Medication Instructions:  Your physician recommends that you continue on your current medications as directed. Please refer to the Current Medication list given to you today.  *If you need a refill on your cardiac medications before your next appointment, please call your pharmacy*  Follow-Up: At [facility], you and your health needs are our priority.  As part of our continuing mission to provide you with exceptional heart care, we have created designated Provider Care Teams.  These Care Teams include your primary Cardiologist (physician) and Advanced Practice Providers (APPs -  Physician Assistants and Nurse Practitioners) who all work together to provide you with the care you need, when you need it.  We recommend signing up for the patient portal called "MyChart".  Sign up information is provided on this After Visit Summary.  MyChart is used to connect with patients for Virtual Visits (Telemedicine).  Patients are able to view lab/test results, encounter notes, upcoming appointments, etc.  Non-urgent messages can be sent to your provider as well.   To learn more about what you can do with MyChart, go to [email or web address].    Your next appointment:   12 month(s)  Provider:   James Hochrein, MD     

## 2023-03-24 ENCOUNTER — Other Ambulatory Visit: Payer: Self-pay | Admitting: Internal Medicine

## 2023-03-27 ENCOUNTER — Encounter: Payer: Medicare Other | Admitting: Internal Medicine

## 2023-08-15 DIAGNOSIS — L570 Actinic keratosis: Secondary | ICD-10-CM | POA: Diagnosis not present

## 2023-08-15 DIAGNOSIS — D1722 Benign lipomatous neoplasm of skin and subcutaneous tissue of left arm: Secondary | ICD-10-CM | POA: Diagnosis not present

## 2023-08-15 DIAGNOSIS — L720 Epidermal cyst: Secondary | ICD-10-CM | POA: Diagnosis not present

## 2023-08-15 DIAGNOSIS — Z85828 Personal history of other malignant neoplasm of skin: Secondary | ICD-10-CM | POA: Diagnosis not present

## 2023-09-19 ENCOUNTER — Ambulatory Visit (INDEPENDENT_AMBULATORY_CARE_PROVIDER_SITE_OTHER): Payer: Federal, State, Local not specified - PPO

## 2023-09-19 DIAGNOSIS — Z23 Encounter for immunization: Secondary | ICD-10-CM | POA: Diagnosis not present

## 2023-09-19 NOTE — Progress Notes (Signed)
Patient presented in office today for his HD Flu vaccine. HD Flu vaccine was administered into his left deltoid. Patient tolerated vaccine well and the injection site looked fine. Patient advised to report to office if he noticed any adverse reactions.

## 2023-11-13 DIAGNOSIS — H35412 Lattice degeneration of retina, left eye: Secondary | ICD-10-CM | POA: Diagnosis not present

## 2023-11-13 DIAGNOSIS — H35411 Lattice degeneration of retina, right eye: Secondary | ICD-10-CM | POA: Diagnosis not present

## 2024-03-05 ENCOUNTER — Ambulatory Visit (INDEPENDENT_AMBULATORY_CARE_PROVIDER_SITE_OTHER): Payer: Medicare Other

## 2024-03-05 VITALS — Ht 67.0 in | Wt 184.0 lb

## 2024-03-05 DIAGNOSIS — Z0000 Encounter for general adult medical examination without abnormal findings: Secondary | ICD-10-CM

## 2024-03-05 DIAGNOSIS — Z87891 Personal history of nicotine dependence: Secondary | ICD-10-CM

## 2024-03-05 DIAGNOSIS — Z122 Encounter for screening for malignant neoplasm of respiratory organs: Secondary | ICD-10-CM | POA: Diagnosis not present

## 2024-03-05 NOTE — Progress Notes (Signed)
 Subjective:   Martin Walker is a 68 y.o. who presents for a Medicare Wellness preventive visit.  Visit Complete: Virtual I connected with  Martin Walker on 03/05/24 by a audio enabled telemedicine application and verified that I am speaking with the correct person using two identifiers.  Patient Location: Home  Provider Location: Office/Clinic  I discussed the limitations of evaluation and management by telemedicine. The patient expressed understanding and agreed to proceed.  Vital Signs: Because this visit was a virtual/telehealth visit, some criteria may be missing or patient reported. Any vitals not documented were not able to be obtained and vitals that have been documented are patient reported.  VideoDeclined- This patient declined Librarian, academic. Therefore the visit was completed with audio only.  AWV Questionnaire: Yes: Patient Medicare AWV questionnaire was completed by the patient on 03/01/2024; I have confirmed that all information answered by patient is correct and no changes since this date.  Cardiac Risk Factors include: male gender;hypertension;dyslipidemia;advanced age (>71men, >2 women)     Objective:    Today's Vitals   03/05/24 1436  Weight: 184 lb (83.5 kg)  Height: 5\' 7"  (1.702 m)   Body mass index is 28.82 kg/m.     03/05/2024    2:35 PM 03/02/2023    2:01 PM  Advanced Directives  Does Patient Have a Medical Advance Directive? No No  Would patient like information on creating a medical advance directive? No - Patient declined No - Patient declined    Current Medications (verified) Outpatient Encounter Medications as of 03/05/2024  Medication Sig   ALREX 0.2 % SUSP Place 2 drops into both eyes as needed. As directed   aspirin EC 81 MG tablet Take 1 tablet (81 mg total) by mouth daily.   atorvastatin (LIPITOR) 20 MG tablet Take 1 tablet (20 mg total) by mouth daily.   fluorouracil (EFUDEX) 5 % cream Apply  topically 2 (two) times daily.   fluticasone (FLONASE) 50 MCG/ACT nasal spray Use 2 spray(s) in each nostril once daily   hydrochlorothiazide (HYDRODIURIL) 25 MG tablet Take 1 tablet (25 mg total) by mouth daily.   hydrocortisone (ANUSOL-HC) 25 MG suppository Place 1 suppository (25 mg total) rectally at bedtime as needed for hemorrhoids or anal itching.   hydrocortisone 2.5 % cream Apply topically 2 (two) times a week.   ketoconazole (NIZORAL) 2 % cream Apply topically.   meloxicam (MOBIC) 15 MG tablet Take 1 tablet (15 mg total) by mouth daily as needed for pain.   metoprolol tartrate (LOPRESSOR) 50 MG tablet Take 1 tablet (50 mg total) by mouth 2 (two) times daily.   VITAMIN D PO Take by mouth.   No facility-administered encounter medications on file as of 03/05/2024.    Allergies (verified) Codeine   History: Past Medical History:  Diagnosis Date   Allergy    seasonal allerties   Arthritis    Asthma    chilhood   Hyperlipidemia    Hypertension    Irregular heart beat    on medication.   Rhinitis, allergic    skin test pos 11/26/08- dust,cat   Rhinosinusitis    recurrent   Past Surgical History:  Procedure Laterality Date   COLONOSCOPY     left knee     right wrist     ROTATOR CUFF REPAIR     left   Family History  Problem Relation Age of Onset   Hypertension Mother    Thyroid disease Mother  Heart disease Father    Lung cancer Other        GRANDFATHER   Colon cancer Other        UNCLE   Other Other        GRANDMOTHER WITH BRAIN TUMOR   Colon polyps Neg Hx    Esophageal cancer Neg Hx    Rectal cancer Neg Hx    Stomach cancer Neg Hx    Social History   Socioeconomic History   Marital status: Married    Spouse name: Not on file   Number of children: Not on file   Years of education: Not on file   Highest education level: Not on file  Occupational History   Occupation: Post [employer]  Tobacco Use   Smoking status: Former    Current packs/day: 0.00     Average packs/day: 1.5 packs/day for 35.0 years (52.5 ttl pk-yrs)    Types: Cigarettes    Start date: 04/23/1976    Quit date: 04/24/2011    Years since quitting: 12.8    Passive exposure: Past   Smokeless tobacco: Former   Tobacco comments:    quit in early 20's  Vaping Use   Vaping status: Never Used  Substance and Sexual Activity   Alcohol use: Yes    Comment: 6 beers/ year   Drug use: Not Currently    Types: Marijuana    Comment: age 68 - 19 very little   Sexual activity: Yes  Other Topics Concern   Not on file  Social History Narrative   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   Drinks six caffeinated beverages a day.    Social Drivers of Corporate investment banker Strain: Low Risk  (03/05/2024)   Overall Financial Resource Strain (CARDIA)    Difficulty of Paying Living Expenses: Not hard at all  Food Insecurity: No Food Insecurity (03/05/2024)   Hunger Vital Sign    Worried About Running Out of Food in the Last Year: Never true    Ran Out of Food in the Last Year: Never true  Transportation Needs: No Transportation Needs (03/05/2024)   PRAPARE - Administrator, Civil Service (Medical): No    Lack of Transportation (Non-Medical): No  Physical Activity: Sufficiently Active (03/05/2024)   Exercise Vital Sign    Days of Exercise per Week: 5 days    Minutes of Exercise per Session: 30 min  Recent Concern: Physical Activity - Insufficiently Active (03/01/2024)   Exercise Vital Sign    Days of Exercise per Week: 3 days    Minutes of Exercise per Session: 30 min  Stress: No Stress Concern Present (03/05/2024)   [employer] of Occupational Health - Occupational Stress Questionnaire    Feeling of Stress : Not at all  Social Connections: Moderately Integrated (03/05/2024)   Social Connection and Isolation Panel [NHANES]    Frequency of Communication with Friends and Family: More than three times a week    Frequency of Social  Gatherings with Friends and Family: More than three times a week    Attends Religious Services: Never    Database administrator or Organizations: No    Attends Engineer, structural: More than 4 times per year    Marital Status: Married  Recent Concern: Social Connections - Moderately Isolated (03/01/2024)   Social Connection and Isolation Panel [NHANES]    Frequency of Communication with Friends and Family: More than three times  a week    Frequency of Social Gatherings with Friends and Family: Twice a week    Attends Religious Services: Never    Database administrator or Organizations: No    Attends Engineer, structural: Not on file    Marital Status: Married    Tobacco Counseling - Former Smoker Counseling given: Yes Tobacco comments: quit in early 20's Ordered a Lung Cancer Screening test today,   Clinical Intake:  Pre-visit preparation completed: Yes  Pain : No/denies pain     BMI - recorded: 28.82 Nutritional Status: BMI 25 -29 Overweight Nutritional Risks: None Diabetes: No  How often do you need to have someone help you when you read instructions, pamphlets, or other written materials from your doctor or pharmacy?: 1 - Never  Interpreter Needed?: No  Information entered by :: Hassell Halim, CMA   Activities of Daily Living     03/05/2024    2:31 PM 03/01/2024    9:29 AM  In your present state of health, do you have any difficulty performing the following activities:  Hearing? 0 0  Vision? 0 0  Difficulty concentrating or making decisions? 0 0  Walking or climbing stairs? 0 0  Dressing or bathing? 0 0  Doing errands, shopping? 0 0  Preparing Food and eating ? N N  Using the Toilet? N N  In the past six months, have you accidently leaked urine? N N  Do you have problems with loss of bowel control? N N  Managing your Medications? N N  Managing your Finances? N N  Housekeeping or managing your Housekeeping? N N    Patient Care Team: Corwin Levins, MD as PCP - Reatha Armour, MD as PCP - Cardiology (Cardiology) Marzella Schlein., MD (Ophthalmology)  Indicate any recent Medical Services you may have received from other than [facility] providers in the past year (date may be approximate).     Assessment:   This is a routine wellness examination for B[place]er.  Hearing/Vision screen Hearing Screening - Comments:: Wears 1 hearing aid in the left ear Vision Screening - Comments:: Wears rx glasses - up to date with routine eye exams with [facility]   Goals Addressed               This Visit's Progress     Patient Stated (pt-stated)        Patient stated that he wants to stay active and exercising (power walking more).       Depression Screen     03/05/2024    2:42 PM 03/02/2023    1:59 PM 01/31/2022    2:03 PM 04/06/2021    1:21 PM 01/28/2021    2:58 PM 01/28/2021    2:57 PM 12/27/2018    2:45 PM  PHQ 2/9 Scores  PHQ - 2 Score 0 0 0 0 0 0 0  PHQ- 9 Score 0 0   0      Fall Risk     03/05/2024    2:43 PM 03/01/2024    9:29 AM 03/02/2023    2:02 PM 02/26/2023    9:18 AM 01/31/2022    2:02 PM  Fall Risk   Falls in the past year? 0 0  1 0  Number falls in past yr: 0 0 1 1 0  Injury with Fall? 0 0 0 0 0  Risk for fall due to : No Fall Risks      Follow up Falls  evaluation completed;Falls prevention discussed  Falls evaluation completed      MEDICARE RISK AT HOME:  Medicare Risk at Home Any stairs in or around the home?: Yes If so, are there any without handrails?: No Home free of loose throw rugs in walkways, pet beds, electrical cords, etc?: Yes Adequate lighting in your home to reduce risk of falls?: Yes Life alert?: No Use of a cane, walker or w/c?: No Grab bars in the bathroom?: No Shower chair or bench in shower?: No Elevated toilet seat or a handicapped toilet?: No  TIMED UP AND GO:  Was the test performed?  No  Cognitive Function: 6CIT completed        03/05/2024    2:44 PM 03/02/2023    2:03 PM   6CIT Screen  What Year? 0 points 0 points  What month? 0 points 0 points  What time? 0 points 0 points  Count back from 20 0 points 0 points  Months in reverse 0 points 0 points  Repeat phrase 0 points 0 points  Total Score 0 points 0 points    Immunizations Immunization History  Administered Date(s) Administered   Fluad Trivalent(High Dose 65+) 09/19/2023   Influenza Split 10/06/2011, 09/20/2012   Influenza, High Dose Seasonal PF 10/10/2013   Influenza,inj,Quad PF,6+ Mos 11/04/2014, 10/28/2015, 10/06/2016, 10/11/2017, 10/11/2018, 10/03/2019, 10/01/2020, 09/22/2021, 09/15/2022   PFIZER Comirnaty(Gray Top)Covid-19 Tri-Sucrose Vaccine 07/07/2021   PFIZER(Purple Top)SARS-COV-2 Vaccination 04/10/2020, 05/06/2020, 11/27/2020   PNEUMOCOCCAL CONJUGATE-20 03/13/2023   Tdap 10/22/2012, 03/02/2023   Zoster Recombinant(Shingrix) 11/02/2022, 02/09/2023    Screening Tests Health Maintenance  Topic Date Due   COVID-19 Vaccine (5 - 2024-25 season) 08/27/2023   Medicare Annual Wellness (AWV)  03/05/2025   Colonoscopy  09/15/2028   DTaP/Tdap/Td (3 - Td or Tdap) 03/01/2033   Pneumonia Vaccine 42+ Years old  Completed   INFLUENZA VACCINE  Completed   Hepatitis C Screening  Completed   Zoster Vaccines- Shingrix  Completed   HPV VACCINES  Aged Out   Lung Cancer Screening  Discontinued    Health Maintenance  Health Maintenance Due  Topic Date Due   COVID-19 Vaccine (5 - 2024-25 season) 08/27/2023   Health Maintenance Items Addressed: 03/05/2024 Lung Cancer Screening ordered today.   Additional Screening:  Vision Screening: Recommended annual ophthalmology exams for early detection of glaucoma and other disorders of the eye. Pt stated he has annual eye exams with Dr Lucretia Roers of [facility].  Dental Screening: Recommended annual dental exams for proper oral hygiene.  Community Resource Referral / Chronic Care Management: CRR required this visit?  No   CCM required this visit?   No     Plan:     I have personally reviewed and noted the following in the patient's chart:   Medical and social history Use of alcohol, tobacco or illicit drugs  Current medications and supplements including opioid prescriptions. Patient is not currently taking opioid prescriptions. Functional ability and status Nutritional status Physical activity Advanced directives List of other physicians Hospitalizations, surgeries, and ER visits in previous 12 months Vitals Screenings to include cognitive, depression, and falls Referrals and appointments  In addition, I have reviewed and discussed with patient certain preventive protocols, quality metrics, and best practice recommendations. A written personalized care plan for preventive services as well as general preventive health recommendations were provided to patient.     Darreld Mclean, CMA   03/05/2024   After Visit Summary: (MyChart) Due to this being a telephonic visit, the after  visit summary with patients personalized plan was offered to patient via MyChart   Notes: Please refer to Routing Comments.

## 2024-03-05 NOTE — Patient Instructions (Addendum)
 Martin Walker , Thank you for taking time to come for your Medicare Wellness Visit. I appreciate your ongoing commitment to your health goals. Please review the following plan we discussed and let me know if I can assist you in the future.   Referrals/Orders/Follow-Ups/Clinician Recommendations: Aim for 30 minutes of exercise or brisk walking, 6-8 glasses of water, and 5 servings of fruits and vegetables each day. Ordered a Lung Cancer Screening test.    This is a list of the screening recommended for you and due dates:  Health Maintenance  Topic Date Due   COVID-19 Vaccine (5 - 2024-25 season) 08/27/2023   Medicare Annual Wellness Visit  03/05/2025   Colon Cancer Screening  09/15/2028   DTaP/Tdap/Td vaccine (3 - Td or Tdap) 03/01/2033   Pneumonia Vaccine  Completed   Flu Shot  Completed   Hepatitis C Screening  Completed   Zoster (Shingles) Vaccine  Completed   HPV Vaccine  Aged Out   Screening for Lung Cancer  Discontinued    Advanced directives: (Declined) Advance directive discussed with you today. Even though you declined this today, please call our office should you change your mind, and we can give you the proper paperwork for you to fill out.  Next Medicare Annual Wellness Visit scheduled for next year: Yes

## 2024-03-20 ENCOUNTER — Other Ambulatory Visit

## 2024-03-20 DIAGNOSIS — Z125 Encounter for screening for malignant neoplasm of prostate: Secondary | ICD-10-CM

## 2024-03-20 DIAGNOSIS — R739 Hyperglycemia, unspecified: Secondary | ICD-10-CM | POA: Diagnosis not present

## 2024-03-20 DIAGNOSIS — E559 Vitamin D deficiency, unspecified: Secondary | ICD-10-CM | POA: Diagnosis not present

## 2024-03-20 DIAGNOSIS — E538 Deficiency of other specified B group vitamins: Secondary | ICD-10-CM

## 2024-03-20 DIAGNOSIS — E7800 Pure hypercholesterolemia, unspecified: Secondary | ICD-10-CM | POA: Diagnosis not present

## 2024-03-20 LAB — BASIC METABOLIC PANEL WITH GFR
BUN: 18 mg/dL (ref 6–23)
CO2: 30 meq/L (ref 19–32)
Calcium: 9.8 mg/dL (ref 8.4–10.5)
Chloride: 101 meq/L (ref 96–112)
Creatinine, Ser: 1.18 mg/dL (ref 0.40–1.50)
GFR: 64.08 mL/min (ref >60.00)
Glucose, Bld: 98 mg/dL (ref 70–99)
Potassium: 4.8 meq/L (ref 3.5–5.1)
Sodium: 140 meq/L (ref 135–145)

## 2024-03-20 LAB — CBC WITH DIFFERENTIAL/PLATELET
Basophils Absolute: 0.1 10*3/uL (ref 0.0–0.1)
Basophils Relative: 1 % (ref 0.0–3.0)
Eosinophils Absolute: 0.4 10*3/uL (ref 0.0–0.7)
Eosinophils Relative: 5.4 % — ABNORMAL HIGH (ref 0.0–5.0)
HCT: 44.8 % (ref 39.0–52.0)
Hemoglobin: 15.1 g/dL (ref 13.0–17.0)
Lymphocytes Relative: 34 % (ref 12.0–46.0)
Lymphs Abs: 2.4 10*3/uL (ref 0.7–4.0)
MCHC: 33.6 g/dL (ref 30.0–36.0)
MCV: 94.8 fl (ref 78.0–100.0)
Monocytes Absolute: 0.6 10*3/uL (ref 0.1–1.0)
Monocytes Relative: 8.7 % (ref 3.0–12.0)
Neutro Abs: 3.6 10*3/uL (ref 1.4–7.7)
Neutrophils Relative %: 50.9 % (ref 43.0–77.0)
Platelets: 283 10*3/uL (ref 150.0–400.0)
RBC: 4.73 Mil/uL (ref 4.22–5.81)
RDW: 13.2 % (ref 11.5–15.5)
WBC: 7.1 10*3/uL (ref 4.0–10.5)

## 2024-03-20 LAB — URINALYSIS, ROUTINE W REFLEX MICROSCOPIC
Bilirubin Urine: NEGATIVE
Hgb urine dipstick: NEGATIVE
Ketones, ur: NEGATIVE
Leukocytes,Ua: NEGATIVE
Nitrite: NEGATIVE
RBC / HPF: NONE SEEN (ref 0–?)
Specific Gravity, Urine: 1.02 (ref 1.000–1.030)
Total Protein, Urine: NEGATIVE
Urine Glucose: NEGATIVE
Urobilinogen, UA: 0.2 (ref 0.0–1.0)
pH: 5.5 (ref 5.0–8.0)

## 2024-03-20 LAB — LIPID PANEL
Cholesterol: 143 mg/dL (ref 0–200)
HDL: 33 mg/dL — ABNORMAL LOW (ref >39.00)
LDL Cholesterol: 85 mg/dL (ref 0–99)
NonHDL: 110.14
Total CHOL/HDL Ratio: 4
Triglycerides: 128 mg/dL (ref 0.0–149.0)
VLDL: 25.6 mg/dL (ref 0.0–40.0)

## 2024-03-20 LAB — HEMOGLOBIN A1C: Hgb A1c MFr Bld: 5.7 % (ref 4.6–6.5)

## 2024-03-20 LAB — HEPATIC FUNCTION PANEL
ALT: 29 U/L (ref 0–53)
AST: 27 U/L (ref 0–37)
Albumin: 4.7 g/dL (ref 3.5–5.2)
Alkaline Phosphatase: 74 U/L (ref 39–117)
Bilirubin, Direct: 0.2 mg/dL (ref 0.0–0.3)
Total Bilirubin: 0.8 mg/dL (ref 0.2–1.2)
Total Protein: 7.5 g/dL (ref 6.0–8.3)

## 2024-03-21 LAB — TSH: TSH: 1.95 u[IU]/mL (ref 0.35–5.50)

## 2024-03-21 LAB — VITAMIN D 25 HYDROXY (VIT D DEFICIENCY, FRACTURES): VITD: 53.53 ng/mL (ref 30.00–100.00)

## 2024-03-21 LAB — PSA: PSA: 2.3 ng/mL (ref 0.10–4.00)

## 2024-03-21 LAB — VITAMIN B12: Vitamin B-12: 272 pg/mL (ref 211–911)

## 2024-03-26 ENCOUNTER — Encounter: Payer: Self-pay | Admitting: Internal Medicine

## 2024-03-26 ENCOUNTER — Ambulatory Visit (INDEPENDENT_AMBULATORY_CARE_PROVIDER_SITE_OTHER): Payer: Medicare Other | Admitting: Internal Medicine

## 2024-03-26 ENCOUNTER — Other Ambulatory Visit: Payer: Self-pay

## 2024-03-26 ENCOUNTER — Telehealth: Payer: Self-pay | Admitting: Acute Care

## 2024-03-26 VITALS — BP 120/76 | HR 50 | Temp 97.5°F | Ht 67.0 in | Wt 188.0 lb

## 2024-03-26 DIAGNOSIS — J309 Allergic rhinitis, unspecified: Secondary | ICD-10-CM

## 2024-03-26 DIAGNOSIS — Z122 Encounter for screening for malignant neoplasm of respiratory organs: Secondary | ICD-10-CM

## 2024-03-26 DIAGNOSIS — Z125 Encounter for screening for malignant neoplasm of prostate: Secondary | ICD-10-CM

## 2024-03-26 DIAGNOSIS — E7800 Pure hypercholesterolemia, unspecified: Secondary | ICD-10-CM | POA: Diagnosis not present

## 2024-03-26 DIAGNOSIS — Z87891 Personal history of nicotine dependence: Secondary | ICD-10-CM

## 2024-03-26 DIAGNOSIS — I10 Essential (primary) hypertension: Secondary | ICD-10-CM | POA: Diagnosis not present

## 2024-03-26 DIAGNOSIS — Z0000 Encounter for general adult medical examination without abnormal findings: Secondary | ICD-10-CM | POA: Diagnosis not present

## 2024-03-26 DIAGNOSIS — E559 Vitamin D deficiency, unspecified: Secondary | ICD-10-CM

## 2024-03-26 DIAGNOSIS — E538 Deficiency of other specified B group vitamins: Secondary | ICD-10-CM | POA: Diagnosis not present

## 2024-03-26 DIAGNOSIS — R739 Hyperglycemia, unspecified: Secondary | ICD-10-CM

## 2024-03-26 DIAGNOSIS — Z0001 Encounter for general adult medical examination with abnormal findings: Secondary | ICD-10-CM

## 2024-03-26 NOTE — Patient Instructions (Signed)
 Ok to take the B12 1000 mcg per day for 6 mo  Please take OTC Vitamin D3 at 2000 units per day, indefinitely  Please continue all other medications as before, and refills have been done if requested.  Please have the pharmacy call with any other refills you may need.  Please continue your efforts at being more active, low cholesterol diet, and weight control.  You are otherwise up to date with prevention measures today.  Please keep your appointments with your specialists as you may have planned  Please make an Appointment to return for your 1 year visit, or sooner if needed, with Lab testing by Appointment as well, to be done about 3-5 days before at the FIRST FLOOR Lab (so this is for TWO appointments - please see the scheduling desk as you leave)

## 2024-03-26 NOTE — Telephone Encounter (Signed)
 Lung Cancer Screening Narrative/Criteria Questionnaire (Cigarette Smokers Only- No Cigars/Pipes/vapes)   Martin Walker   SDMV:04/24/24 at 1130am/ [facility]                                           [birth year]                         LDCT: 04/25/24 at 3pm / [facility]    68 y.o.   Phone: [phone]  Lung Screening Narrative (confirm age 52-77 yrs Medicare / 50-80 yrs Private pay insurance)   Insurance information:Medicare A/B   Referring Provider:John MD   This screening involves an initial phone call with a team member from our program. It is called a shared decision making visit. The initial meeting is required by insurance and Medicare to make sure you understand the program. This appointment takes about 15-20 minutes to complete. The CT scan will completed at a separate date/time. This scan takes about 5-10 minutes to complete and you may eat and drink before and after the scan.  Criteria questions for Lung Cancer Screening:   Are you a current or former smoker? Former Age began smoking: 68 yo   If you are a former smoker, what year did you quit smoking? 2012   To calculate your smoking history, I need an accurate estimate of how many packs of cigarettes you smoked per day and for how many years. (Not just the number of PPD you are now smoking)   Years smoking 39 x Packs per day rought 1/2 PPD or little more = Pack years 20   (at least 20 pack yrs)   (Make sure they understand that we need to know how much they have smoked in the past, not just the number of PPD they are smoking now)  Do you have a personal history of cancer?  No    Do you have a family history of cancer? Yes  (cancer type and and relative) pat Grandfather/'all over' unsure  Are you coughing up blood?  No  Have you had unexplained weight loss of 15 lbs or more in the last 6 months? No  It looks like you meet all criteria.     Additional information: N/A

## 2024-03-26 NOTE — Progress Notes (Unsigned)
 Patient ID: Martin Walker, male   DOB: Aug 12, 1956, 68 y.o.   MRN: [id number]         Chief Complaint:: wellness exam and left knee arthritis, allergies, hld, hyperglycemia, htn, low vit d and b12       HPI:  Martin Walker is a 68 y.o. male here for wellness exam; up to date                        Also Pt denies chest pain, increased sob or doe, wheezing, orthopnea, PND, increased LE swelling, palpitations, dizziness or syncope.   Pt denies polydipsia, polyuria, or new focal neuro s/s.    Pt denies fever, wt loss, night sweats, loss of appetite, or other constitutional symptoms Does have worsening recent left knee arthritis pain.     Wt Readings from Last 3 Encounters:  03/26/24 188 lb (85.3 kg)  03/05/24 184 lb (83.5 kg)  03/17/23 183 lb (83 kg)   BP Readings from Last 3 Encounters:  03/26/24 120/76  03/17/23 (!) 144/92  03/02/23 130/75   Immunization History  Administered Date(s) Administered   Fluad Trivalent(High Dose 65+) 09/19/2023   Influenza Split 10/06/2011, 09/20/2012   Influenza, High Dose Seasonal PF 10/10/2013   Influenza,inj,Quad PF,6+ Mos 11/04/2014, 10/28/2015, 10/06/2016, 10/11/2017, 10/11/2018, 10/03/2019, 10/01/2020, 09/22/2021, 09/15/2022   PFIZER Comirnaty(Gray Top)Covid-19 Tri-Sucrose Vaccine 07/07/2021   PFIZER(Purple Top)SARS-COV-2 Vaccination 04/10/2020, 05/06/2020, 11/27/2020   PNEUMOCOCCAL CONJUGATE-20 03/13/2023   Tdap 10/22/2012, 03/02/2023   Zoster Recombinant(Shingrix) 11/02/2022, 02/09/2023   There are no preventive care reminders to display for this patient.     Past Medical History:  Diagnosis Date   Allergy    seasonal allerties   Arthritis    Asthma    chilhood   Hyperlipidemia    Hypertension    Irregular heart beat    on medication.   Rhinitis, allergic    skin test pos 11/26/08- dust,cat   Rhinosinusitis    recurrent   Past Surgical History:  Procedure Laterality Date   COLONOSCOPY     left knee     right wrist      ROTATOR CUFF REPAIR     left    reports that he quit smoking about 12 years ago. His smoking use included cigarettes. He started smoking about 47 years ago. He has a 52.5 pack-year smoking history. He has been exposed to tobacco smoke. He has quit using smokeless tobacco. He reports current alcohol use. He reports that he does not currently use drugs after having used the following drugs: Marijuana. family history includes Colon cancer in an other family member; Heart disease in his father; Hypertension in his mother; Lung cancer in an other family member; Other in an other family member; Thyroid disease in his mother. Allergies  Allergen Reactions   Codeine     Causes peeling of the hands   Current Outpatient Medications on File Prior to Visit  Medication Sig Dispense Refill   ALREX 0.2 % SUSP Place 2 drops into both eyes as needed. As directed     aspirin EC 81 MG tablet Take 1 tablet (81 mg total) by mouth daily. 90 tablet 11   fluorouracil (EFUDEX) 5 % cream Apply topically 2 (two) times daily.     fluticasone (FLONASE) 50 MCG/ACT nasal spray Use 2 spray(s) in each nostril once daily 48 g 3   hydrocortisone (ANUSOL-HC) 25 MG suppository Place 1 suppository (25 mg total) rectally at bedtime as  needed for hemorrhoids or anal itching. 12 suppository 3   hydrocortisone 2.5 % cream Apply topically 2 (two) times a week.     ketoconazole (NIZORAL) 2 % cream Apply topically.     meloxicam (MOBIC) 15 MG tablet Take 1 tablet (15 mg total) by mouth daily as needed for pain. 90 tablet 1   VITAMIN D PO Take by mouth.     No current facility-administered medications on file prior to visit.        ROS:  All others reviewed and negative.  Objective        PE:  BP 120/76 (BP Location: Left Arm, Patient Position: Sitting, Cuff Size: Normal)   Pulse (!) 50   Temp (!) 97.5 F (36.4 C) (Oral)   Ht 5\' 7"  (1.702 m)   Wt 188 lb (85.3 kg)   SpO2 97%   BMI 29.44 kg/m                  Constitutional: Pt appears in NAD               HENT: Head: NCAT.                Right Ear: External ear normal.                 Left Ear: External ear normal.                Eyes: . Pupils are equal, round, and reactive to light. Conjunctivae and EOM are normal               Nose: without d/c or deformity               Neck: Neck supple. Gross normal ROM               Cardiovascular: Normal rate and regular rhythm.                 Pulmonary/Chest: Effort normal and breath sounds without rales or wheezing.                Abd:  Soft, NT, ND, + BS, no organomegaly               Neurological: Pt is alert. At baseline orientation, motor grossly intact               Skin: Skin is warm. No rashes, no other new lesions, LE edema - none               Psychiatric: Pt behavior is normal without agitation   Micro: none  Cardiac tracings I have personally interpreted today:  none  Pertinent Radiological findings (summarize): none   Lab Results  Component Value Date   WBC 7.1 03/20/2024   HGB 15.1 03/20/2024   HCT 44.8 03/20/2024   PLT 283.0 03/20/2024   GLUCOSE 98 03/20/2024   CHOL 143 03/20/2024   TRIG 128.0 03/20/2024   HDL 33.00 (L) 03/20/2024   LDLDIRECT 167.4 12/11/2013   LDLCALC 85 03/20/2024   ALT 29 03/20/2024   AST 27 03/20/2024   NA 140 03/20/2024   K 4.8 03/20/2024   CL 101 03/20/2024   CREATININE 1.18 03/20/2024   BUN 18 03/20/2024   CO2 30 03/20/2024   TSH 1.95 03/20/2024   PSA 2.30 03/20/2024   HGBA1C 5.7 03/20/2024   MICROALBUR 1.0 12/14/2016   Assessment/Plan:  Andi Mahaffy is a 68 y.o. White or Caucasian [1] male  with  has a past medical history of Allergy, Arthritis, Asthma, Hyperlipidemia, Hypertension, Irregular heart beat, Rhinitis, allergic, and Rhinosinusitis.  Encounter for well adult exam with abnormal findings Age and sex appropriate education and counseling updated with regular exercise and diet Referrals for preventative services - none  needed Immunizations addressed - none needed Smoking counseling  - none needed Evidence for depression or other mood disorder - none significant Most recent labs reviewed. I have personally reviewed and have noted: 1) the patient's medical and social history 2) The patient's current medications and supplements 3) The patient's height, weight, and BMI have been recorded in the chart   Allergic rhinitis Mild to mod, for restart flonase asd,  to f/u any worsening symptoms or concerns  Hyperlipidemia Lab Results  Component Value Date   LDLCALC 85 03/20/2024   Stable, pt to continue current statin lipitor 20 qd   Hyperglycemia Lab Results  Component Value Date   HGBA1C 5.7 03/20/2024   Stable, pt to continue current medical treatment  - diet, wt control   HTN (hypertension) BP Readings from Last 3 Encounters:  03/26/24 120/76  03/17/23 (!) 144/92  03/02/23 130/75   Stable, pt to continue medical treatment hct 25 every day, lopressor 50 bid   Vitamin D deficiency Last vitamin D Lab Results  Component Value Date   VD25OH 53.53 03/20/2024   Stable, cont oral replacement   B12 deficiency Lab Results  Component Value Date   VITAMINB12 272 03/20/2024   Low, to start oral replacement - b12 1000 mcg qd  Followup: Return in about 1 year (around 03/26/2025).  Oliver Barre, MD 03/29/2024 9:55 PM [facility]

## 2024-03-28 ENCOUNTER — Other Ambulatory Visit: Payer: Self-pay

## 2024-03-28 ENCOUNTER — Other Ambulatory Visit: Payer: Self-pay | Admitting: Internal Medicine

## 2024-03-28 DIAGNOSIS — R002 Palpitations: Secondary | ICD-10-CM

## 2024-03-29 ENCOUNTER — Encounter: Payer: Self-pay | Admitting: Internal Medicine

## 2024-03-29 NOTE — Assessment & Plan Note (Signed)
Mild to mod, for restart flonase asd, to f/u any worsening symptoms or concerns

## 2024-03-29 NOTE — Assessment & Plan Note (Signed)

## 2024-03-29 NOTE — Assessment & Plan Note (Signed)
 Last vitamin D Lab Results  Component Value Date   VD25OH 53.53 03/20/2024   Stable, cont oral replacement

## 2024-03-29 NOTE — Assessment & Plan Note (Signed)
 Lab Results  Component Value Date   LDLCALC 85 03/20/2024   Stable, pt to continue current statin lipitor 20 qd

## 2024-03-29 NOTE — Assessment & Plan Note (Signed)
 Lab Results  Component Value Date   VITAMINB12 272 03/20/2024   Low, to start oral replacement - b12 1000 mcg qd

## 2024-03-29 NOTE — Assessment & Plan Note (Signed)
 BP Readings from Last 3 Encounters:  03/26/24 120/76  03/17/23 (!) 144/92  03/02/23 130/75   Stable, pt to continue medical treatment hct 25 every day, lopressor 50 bid

## 2024-03-29 NOTE — Assessment & Plan Note (Signed)
 Lab Results  Component Value Date   HGBA1C 5.7 03/20/2024   Stable, pt to continue current medical treatment  - diet, wt control

## 2024-04-24 ENCOUNTER — Ambulatory Visit: Admitting: Acute Care

## 2024-04-24 ENCOUNTER — Encounter: Payer: Self-pay | Admitting: Acute Care

## 2024-04-24 DIAGNOSIS — Z87891 Personal history of nicotine dependence: Secondary | ICD-10-CM | POA: Diagnosis not present

## 2024-04-24 NOTE — Patient Instructions (Signed)

## 2024-04-24 NOTE — Progress Notes (Signed)
 Virtual Visit via Telephone Note  I connected with Lorinda Root on 04/24/24 at 11:30 AM EDT by telephone and verified that I am speaking with the correct person using two identifiers.  Location: Patient:  At home Provider:  [address], [place], [place], [address]    I discussed the limitations, risks, security and privacy concerns of performing an evaluation and management service by telephone and the availability of in person appointments. I also discussed with the patient that there may be a patient responsible charge related to this service. The patient expressed understanding and agreed to proceed.    Shared Decision Making Visit Lung Cancer Screening Program [phone])   Eligibility: Age 68 y.o. Pack Years Smoking History Calculation 20 pack years (# packs/per year x # years smoked) Recent History of coughing up blood  no Unexplained weight loss? no ( >Than 15 pounds within the last 6 months ) Prior History Lung / other cancer no (Diagnosis within the last 5 years already requiring surveillance chest CT Scans). Smoking Status Former Smoker Former Smokers: Years since quit: 13 years  Quit Date: 04/24/2011  Visit Components: Discussion included one or more decision making aids. yes Discussion included risk/benefits of screening. yes Discussion included potential follow up diagnostic testing for abnormal scans. yes Discussion included meaning and risk of over diagnosis. yes Discussion included meaning and risk of False Positives. yes Discussion included meaning of total radiation exposure. yes  Counseling Included: Importance of adherence to annual lung cancer LDCT screening. yes Impact of comorbidities on ability to participate in the program. yes Ability and willingness to under diagnostic treatment. yes  Smoking Cessation Counseling: Current Smokers:  Discussed importance of smoking cessation. yes Information about tobacco cessation classes and  interventions provided to patient. yes Patient provided with "ticket" for LDCT Scan.  NA Symptomatic Patient. no  Counseling NA Diagnosis Code: Tobacco Use Z72.0 Asymptomatic Patient yes  Counseling (Intermediate counseling: > three minutes counseling) E9528 Former Smokers:  Discussed the importance of maintaining cigarette abstinence. yes Diagnosis Code: Personal History of Nicotine Dependence. U13.244 Information about tobacco cessation classes and interventions provided to patient. Yes Patient provided with "ticket" for LDCT Scan. yes Written Order for Lung Cancer Screening with LDCT placed in Epic. Yes (CT Chest Lung Cancer Screening Low Dose W/O CM) [id number] Z12.2-Screening of respiratory organs Z87.891-Personal history of nicotine dependence   Raejean Bullock, NP

## 2024-04-25 ENCOUNTER — Ambulatory Visit
Admission: RE | Admit: 2024-04-25 | Discharge: 2024-04-25 | Disposition: A | Discharge status: Home / Self Care | Source: Ambulatory Visit | Attending: Acute Care | Admitting: Acute Care

## 2024-04-25 DIAGNOSIS — Z87891 Personal history of nicotine dependence: Secondary | ICD-10-CM

## 2024-04-25 DIAGNOSIS — Z122 Encounter for screening for malignant neoplasm of respiratory organs: Secondary | ICD-10-CM

## 2024-05-22 ENCOUNTER — Other Ambulatory Visit: Payer: Self-pay

## 2024-05-22 DIAGNOSIS — F17210 Nicotine dependence, cigarettes, uncomplicated: Secondary | ICD-10-CM

## 2024-05-22 DIAGNOSIS — Z87891 Personal history of nicotine dependence: Secondary | ICD-10-CM

## 2024-05-22 DIAGNOSIS — Z122 Encounter for screening for malignant neoplasm of respiratory organs: Secondary | ICD-10-CM

## 2024-05-23 NOTE — Progress Notes (Unsigned)
  Cardiology Office Note:   Date:  05/24/2024  ID:  Winson, Eichorn Mar 18, 1956, MRN [id number] PCP: Roslyn Coombe, MD  [facility] Providers Cardiologist:  Eilleen Grates, MD {  History of Present Illness:   Martin Walker is a 68 y.o. male who presents for evaluation of palpitations.  He had some chest pain in 2019.  I sent him for a POET (Plain Old Exercise Treadmill) which was non diagnostic.  Perfusion study was negative for ischemia.  I did hear a murmur at the last visit.  Echo demonstrated no significant abnormalities.  Since he was last seen he has done okay.  He gets anxious.  He has some rare palpitations.  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  He does a lot of weed eating. The patient denies any new symptoms such as chest discomfort, neck or arm discomfort. There has been no new shortness of breath, PND or orthopnea. There have been no reported palpitations, presyncope or syncope.   ROS: As stated in the HPI and negative for all other systems.  Studies Reviewed:    EKG:   EKG Interpretation Date/Time:  Friday May 24 2024 14:54:46 EDT Ventricular Rate:  56 PR Interval:  126 QRS Duration:  90 QT Interval:  424 QTC Calculation: 409 R Axis:   17  Text Interpretation: Sinus bradycardia Nonspecific ST abnormality When compared with ECG of 07-Jan-2008 14:05, No significant change was found Confirmed by Eilleen Grates ([id number]) on 05/24/2024 3:17:02 PM    Risk Assessment/Calculations:     Physical Exam:   VS:  BP (!) 142/85 (BP Location: Right Arm, Patient Position: Sitting, Cuff Size: Large)   Pulse (!) 53   Ht 5\' 7"  (1.702 m)   Wt 187 lb 12.8 oz (85.2 kg)   SpO2 97%   BMI 29.41 kg/m    Wt Readings from Last 3 Encounters:  05/24/24 187 lb 12.8 oz (85.2 kg)  03/26/24 188 lb (85.3 kg)  03/05/24 184 lb (83.5 kg)     GEN: Well nourished, well developed in no acute distress NECK: No JVD; No carotid bruits CARDIAC: RRR, 3 out of 6 apical  systolic murmur radiating slightly at the aortic outflow tract, no diastolic murmurs, rubs, gallops RESPIRATORY:  Clear to auscultation without rales, wheezing or rhonchi  ABDOMEN: Soft, non-tender, non-distended EXTREMITIES:  No edema; No deformity   ASSESSMENT AND PLAN:   Palpitations -  These are not particular problematic.  No change in therapy.   HTN - His blood pressure is elevated but he is gena keep a blood pressure diary.  Dyslipidemia - His most recent LDL was 85 with an HDL of 33.  No change in therapy.    Murmur - I will check an echocardiogram.  He had mild aortic sclerosis previous.  The murmur does sound maybe a little louder.    Follow up with me in 1 year.  Signed, Eilleen Grates, MD

## 2024-05-24 ENCOUNTER — Ambulatory Visit: Attending: Cardiology | Admitting: Cardiology

## 2024-05-24 ENCOUNTER — Encounter: Payer: Self-pay | Admitting: Cardiology

## 2024-05-24 VITALS — BP 142/85 | HR 53 | Ht 67.0 in | Wt 187.8 lb

## 2024-05-24 DIAGNOSIS — E785 Hyperlipidemia, unspecified: Secondary | ICD-10-CM | POA: Diagnosis not present

## 2024-05-24 DIAGNOSIS — I10 Essential (primary) hypertension: Secondary | ICD-10-CM | POA: Diagnosis not present

## 2024-05-24 DIAGNOSIS — R011 Cardiac murmur, unspecified: Secondary | ICD-10-CM

## 2024-05-24 DIAGNOSIS — R002 Palpitations: Secondary | ICD-10-CM | POA: Diagnosis not present

## 2024-05-24 NOTE — Patient Instructions (Signed)
 Medication Instructions:  Your physician recommends that you continue on your current medications as directed. Please refer to the Current Medication list given to you today.  *If you need a refill on your cardiac medications before your next appointment, please call your pharmacy*  Lab Work: NONE If you have labs (blood work) drawn today and your tests are completely normal, you will receive your results only by: MyChart Message (if you have MyChart) OR A paper copy in the mail If you have any lab test that is abnormal or we need to change your treatment, we will call you to review the results.  Testing/Procedures: Echocardiogram Your physician has requested that you have an echocardiogram. Echocardiography is a painless test that uses sound waves to create images of your heart. It provides your doctor with information about the size and shape of your heart and how well your heart's chambers and valves are working. This procedure takes approximately one hour. There are no restrictions for this procedure. Please do NOT wear cologne, perfume, aftershave, or lotions (deodorant is allowed). Please arrive 15 minutes prior to your appointment time.  Please note: We ask at that you not bring children with you during ultrasound (echo/ vascular) testing. Due to room size and safety concerns, children are not allowed in the ultrasound rooms during exams. Our front office staff cannot provide observation of children in our lobby area while testing is being conducted. An adult accompanying a patient to their appointment will only be allowed in the ultrasound room at the discretion of the ultrasound technician under special circumstances. We apologize for any inconvenience.   Follow-Up: At [facility], you and your health needs are our priority.  As part of our continuing mission to provide you with exceptional heart care, our providers are all part of one team.  This team includes your primary  Cardiologist (physician) and Advanced Practice Providers or APPs (Physician Assistants and Nurse Practitioners) who all work together to provide you with the care you need, when you need it.  Your next appointment:   1 year(s)  Provider:   Eilleen Grates, MD    We recommend signing up for the patient portal called "MyChart".  Sign up information is provided on this After Visit Summary.  MyChart is used to connect with patients for Virtual Visits (Telemedicine).  Patients are able to view lab/test results, encounter notes, upcoming appointments, etc.  Non-urgent messages can be sent to your provider as well.   To learn more about what you can do with MyChart, go to [email or web address].   Other Instructions

## 2024-07-10 ENCOUNTER — Ambulatory Visit (HOSPITAL_COMMUNITY)
Admission: RE | Admit: 2024-07-10 | Discharge: 2024-07-10 | Disposition: A | Discharge status: Home / Self Care | Source: Ambulatory Visit | Attending: Cardiovascular Disease | Admitting: Cardiovascular Disease

## 2024-07-10 DIAGNOSIS — R011 Cardiac murmur, unspecified: Secondary | ICD-10-CM | POA: Diagnosis present

## 2024-07-10 LAB — ECHOCARDIOGRAM COMPLETE
AR max vel: 1.48 cm2
AV Area VTI: 1.5 cm2
AV Area mean vel: 1.41 cm2
AV Mean grad: 10.2 mmHg
AV Peak grad: 21.7 mmHg
Ao pk vel: 2.33 m/s
Area-P 1/2: 3.95 cm2
S' Lateral: 2.8 cm

## 2024-07-11 ENCOUNTER — Ambulatory Visit: Payer: Self-pay | Admitting: Cardiology

## 2024-10-17 ENCOUNTER — Telehealth: Payer: Self-pay

## 2024-10-17 NOTE — Telephone Encounter (Signed)
 Copied from CRM [id number]. Topic: Appointments - Scheduling Inquiry for Clinic >> Oct 17, 2024 10:35 AM [place] A wrote: Reason for CRM: Patient called to reschedule AWV and Physical that are scheduled on same day for a time 2:00PM and 3:00 PM. Patient was told that he could have both appointments on same day that is why agent did not schedule.

## 2024-10-18 NOTE — Telephone Encounter (Signed)
 According to Pt chart appts have already been scheduled.

## 2024-10-23 ENCOUNTER — Ambulatory Visit (INDEPENDENT_AMBULATORY_CARE_PROVIDER_SITE_OTHER)

## 2024-10-23 DIAGNOSIS — Z23 Encounter for immunization: Secondary | ICD-10-CM

## 2024-10-23 NOTE — Progress Notes (Addendum)
 After obtaining consent, and per orders of Dr. Norleen, injection of HDFL given by Ronnald SHAUNNA Palms. Patient instructed to report any adverse reaction to me immediately.   Medical screening examination/treatment/procedure(s) were performed by non-physician practitioner and as supervising physician I was immediately available for consultation/collaboration.  I agree with above. Karlynn Noel, MD

## 2025-03-27 ENCOUNTER — Encounter: Admitting: Internal Medicine

## 2025-03-31 ENCOUNTER — Ambulatory Visit

## 2025-03-31 ENCOUNTER — Encounter: Admitting: Internal Medicine

## 2025-04-02 ENCOUNTER — Ambulatory Visit
